# Patient Record
Sex: Female | Born: 1980
Health system: Southern US, Community
[De-identification: ages and names within clinical notes are randomized; demographics above are authoritative.]

## PROBLEM LIST (undated history)

## (undated) DIAGNOSIS — F419 Anxiety disorder, unspecified: Secondary | ICD-10-CM

## (undated) DIAGNOSIS — K219 Gastro-esophageal reflux disease without esophagitis: Secondary | ICD-10-CM

## (undated) DIAGNOSIS — G5603 Carpal tunnel syndrome, bilateral upper limbs: Secondary | ICD-10-CM

## (undated) DIAGNOSIS — G8929 Other chronic pain: Secondary | ICD-10-CM

## (undated) DIAGNOSIS — R945 Abnormal results of liver function studies: Secondary | ICD-10-CM

## (undated) DIAGNOSIS — K224 Dyskinesia of esophagus: Secondary | ICD-10-CM

## (undated) DIAGNOSIS — R7989 Other specified abnormal findings of blood chemistry: Secondary | ICD-10-CM

## (undated) DIAGNOSIS — F32A Depression, unspecified: Secondary | ICD-10-CM

## (undated) DIAGNOSIS — M549 Dorsalgia, unspecified: Secondary | ICD-10-CM

## (undated) DIAGNOSIS — F329 Major depressive disorder, single episode, unspecified: Secondary | ICD-10-CM

## (undated) DIAGNOSIS — M542 Cervicalgia: Secondary | ICD-10-CM

## (undated) HISTORY — DX: Anxiety disorder, unspecified: F41.9

## (undated) HISTORY — PX: NOSE SURGERY: SHX723

## (undated) HISTORY — PX: TONSILLECTOMY: SUR1361

## (undated) HISTORY — DX: Depression, unspecified: F32.A

## (undated) HISTORY — PX: CHOLECYSTECTOMY: SHX55

## (undated) HISTORY — DX: Other specified abnormal findings of blood chemistry: R79.89

## (undated) HISTORY — DX: Abnormal results of liver function studies: R94.5

## (undated) HISTORY — PX: DILATION AND CURETTAGE OF UTERUS: SHX78

## (undated) HISTORY — DX: Major depressive disorder, single episode, unspecified: F32.9

---

## 2011-01-04 ENCOUNTER — Emergency Department (HOSPITAL_BASED_OUTPATIENT_CLINIC_OR_DEPARTMENT_OTHER)
Admission: EM | Admit: 2011-01-04 | Discharge: 2011-01-04 | Disposition: A | Discharge status: Home / Self Care | Payer: Self-pay | Attending: Emergency Medicine | Admitting: Emergency Medicine

## 2011-01-04 ENCOUNTER — Encounter: Payer: Self-pay | Admitting: Emergency Medicine

## 2011-01-04 DIAGNOSIS — G8929 Other chronic pain: Secondary | ICD-10-CM | POA: Insufficient documentation

## 2011-01-04 DIAGNOSIS — M542 Cervicalgia: Secondary | ICD-10-CM | POA: Insufficient documentation

## 2011-01-04 DIAGNOSIS — M549 Dorsalgia, unspecified: Secondary | ICD-10-CM | POA: Insufficient documentation

## 2011-01-04 DIAGNOSIS — F17200 Nicotine dependence, unspecified, uncomplicated: Secondary | ICD-10-CM | POA: Insufficient documentation

## 2011-01-04 DIAGNOSIS — M436 Torticollis: Secondary | ICD-10-CM | POA: Insufficient documentation

## 2011-01-04 HISTORY — DX: Cervicalgia: M54.2

## 2011-01-04 HISTORY — DX: Other chronic pain: G89.29

## 2011-01-04 HISTORY — DX: Dorsalgia, unspecified: M54.9

## 2011-01-04 MED ORDER — HYDROCODONE-ACETAMINOPHEN 5-325 MG PO TABS: 2.0000 | ORAL_TABLET | ORAL | Status: AC | PRN

## 2011-01-04 MED ORDER — KETOROLAC TROMETHAMINE 60 MG/2ML IM SOLN
60.0000 mg | Freq: Once | INTRAMUSCULAR | Status: AC
  Administered 2011-01-04: 60 mg via INTRAMUSCULAR
  Filled 2011-01-04: qty 2

## 2011-01-04 MED ORDER — CYCLOBENZAPRINE HCL 10 MG PO TABS: 10.0000 mg | ORAL_TABLET | Freq: Two times a day (BID) | ORAL | Status: AC | PRN

## 2011-01-04 MED ORDER — IBUPROFEN 800 MG PO TABS: 800.0000 mg | ORAL_TABLET | Freq: Three times a day (TID) | ORAL | Status: AC
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
## 2011-01-04 NOTE — ED Notes (Signed)
Left sided neck pain, started yesterday, with numbness/tingling down left arm..  No known injuries.

## 2011-05-24 ENCOUNTER — Emergency Department (HOSPITAL_BASED_OUTPATIENT_CLINIC_OR_DEPARTMENT_OTHER)
Admission: EM | Admit: 2011-05-24 | Discharge: 2011-05-25 | Disposition: A | Discharge status: Home / Self Care | Payer: Medicaid Other | Attending: Emergency Medicine | Admitting: Emergency Medicine

## 2011-05-24 ENCOUNTER — Encounter (HOSPITAL_BASED_OUTPATIENT_CLINIC_OR_DEPARTMENT_OTHER): Payer: Self-pay | Admitting: *Deleted

## 2011-05-24 DIAGNOSIS — R059 Cough, unspecified: Secondary | ICD-10-CM | POA: Insufficient documentation

## 2011-05-24 DIAGNOSIS — J029 Acute pharyngitis, unspecified: Secondary | ICD-10-CM | POA: Insufficient documentation

## 2011-05-24 DIAGNOSIS — G8929 Other chronic pain: Secondary | ICD-10-CM | POA: Insufficient documentation

## 2011-05-24 DIAGNOSIS — F17200 Nicotine dependence, unspecified, uncomplicated: Secondary | ICD-10-CM | POA: Insufficient documentation

## 2011-05-24 DIAGNOSIS — R05 Cough: Secondary | ICD-10-CM | POA: Insufficient documentation

## 2011-05-24 NOTE — ED Provider Notes (Signed)
History  This chart was scribed for Sunnie Nielsen, MD by Bennett Scrape. This patient was seen in room MH09/MH09 and the patient's care was started at 11:37PM.  CSN: [id number] Arrival date & time: 05/24/2011 11:32 PM    Chief Complaint  Patient presents with  . Cough    Patient is a 30 y.o. female presenting with cough. The history is provided by the patient. No language interpreter was used.  Cough The current episode started more than 2 days ago. The problem occurs hourly. The problem has been gradually worsening. The cough is non-productive. There has been no fever.   Gabriela Benitez is a 30 y.o. female who presents to the Emergency Department complaining of one week of a intermittent non-productive cough. Pt states that her doctor told her that it was viral. She confirmed that her daughter is sick as well. Pt stated that she believed that she had strep throat due to seeing white spots on the back of her throat. Pt has a h/o chronic back and neck pain. Pt is a smoker.   Past Medical History  Diagnosis Date  . Back pain   . Chronic neck pain   . Chronic back pain     Past Surgical History  Procedure Date  . Tonsillectomy   . Cholecystectomy   . Cesarean section   . Dilation and curettage of uterus     No family history on file.  History  Substance Use Topics  . Smoking status: Current Everyday Smoker  . Smokeless tobacco: Not on file  . Alcohol Use: Yes     occ    OB History    Grav Para Term Preterm Abortions TAB SAB Ect Mult Living                  Review of Systems  Respiratory: Positive for cough.   All other systems reviewed and are negative.    Allergies  Codeine  Home Medications  No current outpatient prescriptions on file.  Triage Vitals: BP 109/65  Pulse 76  Temp(Src) 98.3 F (36.8 C) (Oral)  Resp 20  Ht 5\' 3"  (1.6 m)  Wt 148 lb (67.132 kg)  BMI 26.22 kg/m2  SpO2 98%  Physical Exam  Nursing note and vitals  reviewed. Constitutional: She is oriented to person, place, and time. She appears well-developed and well-nourished.  HENT:  Head: Normocephalic and atraumatic.       Erythema to the posterior pharynx, no exudates, uvula midline  Eyes: EOM are normal.  Neck: Neck supple.       No lymphnopathy    Cardiovascular: Normal rate and regular rhythm.   Pulmonary/Chest: Effort normal and breath sounds normal.  Abdominal: Soft. There is no tenderness.  Musculoskeletal: Normal range of motion.  Neurological: She is alert and oriented to person, place, and time.  Skin: Skin is warm and dry.  Psychiatric: She has a normal mood and affect. Her behavior is normal.    ED Course  Procedures (including critical care time)  DIAGNOSTIC STUDIES: Oxygen Saturation is 98% on room air, normal by my interpretation.    COORDINATION OF CARE: 11:40PM-Discussed treatment plan with patient at bedside and patient agreed to plan.   Results for orders placed during the hospital encounter of 05/24/11  RAPID STREP SCREEN      Component Value Range   Streptococcus, Group A Screen (Direct) NEGATIVE  NEGATIVE    Rapid strep screen sent, and reviewed   MDM   Pharyngitis with sick  contacts at home, likely viral. No respiratory distress and normal pulmonary exam. Plan outpatient treatment and primary care followup as needed.    I personally performed the services described in this documentation, which was scribed in my presence. The recorded information has been reviewed and considered.     Sunnie Nielsen, MD 05/25/11 0040

## 2011-05-24 NOTE — ED Notes (Signed)
Pt states she has had a cough for about a week. Daughter coughing as well. Saw Dr. Dx'd with virus.

## 2011-06-26 LAB — OB RESULTS CONSOLE ABO/RH: RH Type: POSITIVE

## 2011-06-26 LAB — OB RESULTS CONSOLE HEPATITIS B SURFACE ANTIGEN: Hepatitis B Surface Ag: NEGATIVE

## 2011-07-28 ENCOUNTER — Encounter (HOSPITAL_BASED_OUTPATIENT_CLINIC_OR_DEPARTMENT_OTHER): Payer: Self-pay | Admitting: *Deleted

## 2011-07-28 ENCOUNTER — Emergency Department (HOSPITAL_BASED_OUTPATIENT_CLINIC_OR_DEPARTMENT_OTHER)
Admission: EM | Admit: 2011-07-28 | Discharge: 2011-07-28 | Disposition: A | Discharge status: Home / Self Care | Payer: Medicaid Other | Attending: Emergency Medicine | Admitting: Emergency Medicine

## 2011-07-28 DIAGNOSIS — O2690 Pregnancy related conditions, unspecified, unspecified trimester: Secondary | ICD-10-CM | POA: Insufficient documentation

## 2011-07-28 DIAGNOSIS — G8929 Other chronic pain: Secondary | ICD-10-CM | POA: Insufficient documentation

## 2011-07-28 DIAGNOSIS — Z331 Pregnant state, incidental: Secondary | ICD-10-CM

## 2011-07-28 DIAGNOSIS — G43909 Migraine, unspecified, not intractable, without status migrainosus: Secondary | ICD-10-CM | POA: Insufficient documentation

## 2011-07-28 MED ORDER — FENTANYL CITRATE 0.05 MG/ML IJ SOLN
50.0000 ug | Freq: Once | INTRAMUSCULAR | Status: AC
  Administered 2011-07-28: 50 ug via INTRAVENOUS
  Filled 2011-07-28: qty 2

## 2011-07-28 MED ORDER — ONDANSETRON HCL 4 MG/2ML IJ SOLN
4.0000 mg | Freq: Once | INTRAMUSCULAR | Status: AC
  Administered 2011-07-28: 4 mg via INTRAVENOUS
  Filled 2011-07-28: qty 2

## 2011-07-28 MED ORDER — FENTANYL CITRATE 0.05 MG/ML IJ SOLN
INTRAMUSCULAR | Status: AC
  Filled 2011-07-28: qty 2

## 2011-07-28 MED ORDER — FENTANYL CITRATE 0.05 MG/ML IJ SOLN
50.0000 ug | Freq: Once | INTRAMUSCULAR | Status: AC
  Administered 2011-07-28: 50 ug via INTRAVENOUS

## 2011-07-28 MED ORDER — ONDANSETRON 4 MG PO TBDP: 4.0000 mg | ORAL_TABLET | Freq: Three times a day (TID) | ORAL | Status: AC | PRN

## 2011-07-28 MED ORDER — SODIUM CHLORIDE 0.9 % IV BOLUS (SEPSIS)
500.0000 mL | Freq: Once | INTRAVENOUS | Status: AC
  Administered 2011-07-28: 1000 mL via INTRAVENOUS

## 2011-07-28 NOTE — ED Notes (Signed)
Per mother daughter has had intermittent migraines over the past month, went to OB/GYN on Friday and was given a prescription for vicodin and took one yesterday which helped HA, but gave her esophagel spasms, has taken no meds today

## 2011-07-28 NOTE — ED Provider Notes (Signed)
History     CSN: [id number]  Arrival date & time 07/28/11  0915   First MD Initiated Contact with Patient 07/28/11 0920      Chief Complaint  Patient presents with  . Migraine     HPI Per mother daughter has had intermittent migraines over the past month, went to OB/GYN on Friday and was given a prescription for vicodin and took one yesterday which helped HA, but gave her esophagel spasms, has taken no meds today  Past Medical History  Diagnosis Date  . Back pain   . Chronic neck pain   . Chronic back pain   . Migraine     Past Surgical History  Procedure Date  . Tonsillectomy   . Cholecystectomy   . Cesarean section   . Dilation and curettage of uterus     No family history on file.  History  Substance Use Topics  . Smoking status: Former Games developer  . Smokeless tobacco: Not on file  . Alcohol Use: Yes     occ    OB History    Grav Para Term Preterm Abortions TAB SAB Ect Mult Living   1               Review of Systems Negative except as noted in history of present illness Allergies  Codeine  Home Medications   Current Outpatient Rx  Name Route Sig Dispense Refill  . HYDROCODONE-ACETAMINOPHEN 5-325 MG PO TABS Oral Take 1 tablet by mouth every 6 (six) hours as needed.    Marland Kitchen PRENATAL VITAMIN PO Oral Take by mouth.    . ONDANSETRON 4 MG PO TBDP Oral Take 1 tablet (4 mg total) by mouth every 8 (eight) hours as needed for nausea. 20 tablet 0    BP 99/53  Pulse 72  Temp(Src) 98 F (36.7 C) (Oral)  Resp 19  SpO2 96%  LMP 05/03/2011  Physical Exam  Nursing note and vitals reviewed. Constitutional: She is oriented to person, place, and time. She appears well-developed and well-nourished. She appears distressed.  HENT:  Head: Normocephalic and atraumatic.  Eyes: Pupils are equal, round, and reactive to light.  Neck: Normal range of motion.  Cardiovascular: Normal rate and intact distal pulses.   Pulmonary/Chest: No respiratory distress.  Abdominal:  Normal appearance. She exhibits no distension.  Musculoskeletal: Normal range of motion.  Neurological: She is alert and oriented to person, place, and time. She has normal strength. No cranial nerve deficit or sensory deficit. GCS eye subscore is 4. GCS verbal subscore is 5. GCS motor subscore is 6.       Patient photophobic  Skin: Skin is warm and dry. No rash noted.  Psychiatric: She has a normal mood and affect. Her behavior is normal.    ED Course  Procedures (including critical care time)  Labs Reviewed - No data to display No results found.   1. Migraine   2. Incidental intrauterine pregnancy       MDM  After treatment in the ED the patient feels back to baseline and wants to go home.   Nelia Shi, MD 07/28/11 [phone]

## 2012-01-15 ENCOUNTER — Encounter (HOSPITAL_COMMUNITY): Payer: Self-pay

## 2012-01-18 ENCOUNTER — Encounter (HOSPITAL_COMMUNITY): Payer: Self-pay

## 2012-01-19 ENCOUNTER — Encounter (HOSPITAL_COMMUNITY): Payer: Self-pay

## 2012-01-19 ENCOUNTER — Encounter (HOSPITAL_COMMUNITY)
Admission: RE | Admit: 2012-01-19 | Discharge: 2012-01-19 | Disposition: A | Discharge status: Home / Self Care | Payer: Medicaid Other | Source: Ambulatory Visit | Attending: Obstetrics & Gynecology | Admitting: Obstetrics & Gynecology

## 2012-01-19 HISTORY — DX: Dyskinesia of esophagus: K22.4

## 2012-01-19 HISTORY — DX: Gastro-esophageal reflux disease without esophagitis: K21.9

## 2012-01-19 HISTORY — DX: Carpal tunnel syndrome, bilateral upper limbs: G56.03

## 2012-01-19 LAB — CBC
MCH: 28.4 pg (ref 26.0–34.0)
Platelets: 343 10*3/uL (ref 150–400)
RBC: 3.49 MIL/uL — ABNORMAL LOW (ref 3.87–5.11)
RDW: 13.9 % (ref 11.5–15.5)
WBC: 8.8 10*3/uL (ref 4.0–10.5)

## 2012-01-19 LAB — TYPE AND SCREEN: ABO/RH(D): A POS

## 2012-01-19 LAB — SURGICAL PCR SCREEN: MRSA, PCR: NEGATIVE

## 2012-01-19 NOTE — Patient Instructions (Addendum)
   Your procedure is scheduled on: Wednesday July 31st  Enter through the [employer] [facility] at: 7:45am Pick up the phone at the desk and dial [phone] and inform us of your arrival.  Please call this number if you have any problems the morning of surgery: [phone]  Remember: Do not eat food after midnight: Tuesday Do not drink clear liquids after: Midnight Tuesday Take these medicines the morning of surgery with a SIP OF WATER: Zantac  Do not wear jewelry, make-up, or FINGER nail polish No metal in your hair or on your body. Do not wear lotions, powders, perfumes or deodorant. Do not shave 48 hours prior to surgery. Do not bring valuables to the hospital. Contacts, dentures or bridgework may not be worn into surgery.  Leave suitcase in the car. After Surgery it may be brought to your room. For patients being admitted to the hospital, checkout time is 11:00am the day of discharge.     Remember to use your hibiclens as instructed.Please shower with 1/2 bottle the evening before your surgery and the other 1/2 bottle the morning of surgery. Neck down avoiding private area.

## 2012-01-20 LAB — RPR: RPR Ser Ql: NONREACTIVE

## 2012-01-25 NOTE — OR Nursing (Signed)
Moved to 1430 per [place].surgeon prefers cases moved to afternoon.

## 2012-01-25 NOTE — Progress Notes (Signed)
Patient called to verify surgery start time.  Patient informed surgery 7/31 at 1445.  Patient instructed to arrive at 1315 and no solid foods after midnight 7/30 and clear liquid diet until 1030am DOS 01/27/12.  Patient verbalized understanding.

## 2012-01-26 ENCOUNTER — Other Ambulatory Visit: Payer: Self-pay | Admitting: Obstetrics & Gynecology

## 2012-01-26 ENCOUNTER — Encounter: Payer: Self-pay | Admitting: Obstetrics & Gynecology

## 2012-01-26 NOTE — H&P (Signed)
Gabriela Benitez is a 31 y.o. female presenting for she will be 39 weeks and 0 days time her scheduled cesarean delivery on 01/27/2012. Maternal Medical History:  Reason for admission: [name] is a 31 year old gravida 3 para 1-0-1-1 at 38 weeks and 6 days today by [facility] of 02/03/2012 by last menstrual period of 04/29/2011 and also 18 week ultrasound..  The patient is a transfer from [facility].  She states pregnancy is for the most part been uncomplicated.  She is told her group B strep is negative.  She stated that she labored to 5 cm after being induced at 37 weeks for an unknown reason and in to 5 cm.  Her pregnancy records are unavailable.  She was counseled on risks benefits and alternatives of trial of labor after cesarean section.  As opted for repeat cesarean delivery.  She apparently signed BTL papers that could not be found [place] [facility].  I papers here date 01/20/2012 to informed her that that would be acceptable for proceeding with bilateral tubal ligation at the time of cesarean delivery and that she will undergo only a repeat cesarean delivery.  She was counseled on risk benefits and alternatives of the procedure itself.   Transfusion counseling: The patient is aware of the possibility with this procedure of the use of blood replacement products in the form of a transfusion.  The patient is aware of the risks benefits and alternatives of these.  She is aware that we would only use these on an emergency basis or sometimes if considered to significantly reduce the risk of transfusion requirement during surgery.  The patient is aware of the risk of the each unit of transfusion having a risk of HIV being approximately 1 in 1 million and having a risk of hepatitis being 1 in 2000, with sometimes the development of chronic active hepatitis bleeding to the potential for liver failure and liver transplant, and having a low substantial risk of transfusion reaction were  her body would reject the replacement when we thought she needed it.  The patient understands them wishes to proceed. Cesarean section counseling: The patient is aware that we have decided to deliver the baby by cesarean section.  We believe this to be in the best interest of mom and/or baby at this point.  The patient is aware that the risks specific to the procedure are that there is a potential for bleeding and the possibility of a transfusion, there is a potential for damage to the uterus that would preclude her from having a future vaginal delivery, there is a potential for damage to other intra-abdominal organs to include ovaries, tubes, bladder, ureters, and bowel, sometimes recognized at the time of surgery and repaired at that time with a potential for a larger incision and/or a second surgeon.  The patient is also aware of the fact that these may not be discovered until later date and that she would need to let us know about any postoperative complications by calling me, the office or proceeding to the emergency room.  If these complications are none found at the time of surgery and are discovered later date may need surgical repair at that time.  The patient is also aware the potential for infection and the potential for prolonged hospital stay and the potential for antibiotics on an inpatient and/or outpatient basis.  Patient is aware of the potential for damage to the uterus closed by complications related to the pregnancy that may  require a hysterectomy to control bleeding in the postpartum period.  She understands the above and wishes to proceed with the procedure. She still wishes to proceed with the procedure.   Fetal activity: Perceived fetal activity is normal.    Prenatal complications: No bleeding, cholelithiasis, HIV, hypertension, infection, IUGR, nephrolithiasis, oligohydramnios, placental abnormality, polyhydramnios, pre-eclampsia, preterm labor, substance abuse, thrombocytopenia or  thrombophilia.   Prenatal Complications - Diabetes: none.    OB History    Grav Para Term Preterm Abortions TAB SAB Ect Mult Living   3 1 1  1  1   1      Past Medical History  Diagnosis Date  . Back pain   . Chronic neck pain   . Chronic back pain   . Migraine   . Carpal tunnel syndrome on both sides   . Esophageal spasm     uses aloe vera with success  . GERD (gastroesophageal reflux disease)     Zantac   Past Surgical History  Procedure Date  . Tonsillectomy   . Cholecystectomy   . Cesarean section 2007  . Dilation and curettage of uterus    Family History: family history is not on file. family history is significant for Huntington's chorea Social History:  reports that she has been smoking Cigarettes.  She has a 2.75 pack-year smoking history. She does not have any smokeless tobacco history on file. She reports that she does not drink alcohol or use illicit drugs. Will patient states she uses tobacco 2-4 cigarettes per day.  It has been excessive maternal weight gain and 66 pounds  Prenatal Transfer Tool  Maternal Diabetes: No Genetic Screening: Declined Maternal Ultrasounds/Referrals: Normal Fetal Ultrasounds or other Referrals:  None Maternal Substance Abuse:  Yes:  Type: Smoker Significant Maternal Medications:  None Significant Maternal Lab Results:  None Other Comments:  Transfer OB at 38 weeks.  Review of Systems  Constitutional: Negative.   HENT: Negative.   Eyes: Negative.   Respiratory: Negative.   Cardiovascular: Negative.   Gastrointestinal: Negative.   Genitourinary: Negative.   Musculoskeletal: Negative.   Skin: Negative.   Neurological: Negative.   Endo/Heme/Allergies: Negative.   Psychiatric/Behavioral: Negative.       Last menstrual period 04/29/2011. Maternal Exam:  Abdomen: Patient reports no abdominal tenderness. Surgical scars: low transverse.   Fundal height is Term.   Estimated fetal weight is 7 pounds.   Fetal presentation:  vertex  Introitus: Normal vulva. Normal vagina.  Ferning test: not done.  Nitrazine test: not done. Amniotic fluid character: not assessed.  Pelvis: of concern for delivery.   Cervix: not evaluated.   Physical Exam  Constitutional: She is oriented to person, place, and time. She appears well-developed and well-nourished.  HENT:  Head: Normocephalic and atraumatic.  Mouth/Throat: No oropharyngeal exudate.  Eyes: Pupils are equal, round, and reactive to light. Right eye exhibits no discharge. Left eye exhibits no discharge.  Neck: Normal range of motion. Neck supple. No tracheal deviation present. No thyromegaly present.  Cardiovascular: Normal rate, regular rhythm and normal heart sounds.   Respiratory: Effort normal and breath sounds normal.  GI: Soft.       Gravid nontender  Genitourinary: Vagina normal.  Musculoskeletal: Normal range of motion.  Neurological: She is alert and oriented to person, place, and time. She has normal reflexes.  Skin: Skin is warm and dry. No rash noted.  Psychiatric: She has a normal mood and affect. Her behavior is normal. Judgment and thought content normal.  Prenatal labs: ABO, Rh: --/--/A POS, A POS (07/23 1425) Antibody: NEG (07/23 1425) Rubella:   immune 23 RPR: NON REACTIVE (07/23 1425)  HBsAg: Negative (12/28 0000)  HIV: Non-reactive (12/28 0000)  GBS:   negative   Assessment/Plan: 1. IUP at 30 weeks and 6 days to be 39 weeks and 0 days number schedule cesarean delivery on 01/27/2012 2. Excessive maternal weight gain 3. History cesarean delivery declines TOLAC 4. Family history of Huntington's chorea 5.  tobacco abuse 6. Esophageal spasm 7. Desires BTL, but unfortunately would not be up to do at this time   Plan: 1. Proceed with repeat cesarean delivery, we will use Interceed,  Daeshawn Redmann H. 01/26/2012, 6:12 PM

## 2012-01-27 ENCOUNTER — Encounter (HOSPITAL_COMMUNITY): Payer: Self-pay | Admitting: Anesthesiology

## 2012-01-27 ENCOUNTER — Encounter (HOSPITAL_COMMUNITY): Payer: Self-pay | Admitting: *Deleted

## 2012-01-27 ENCOUNTER — Inpatient Hospital Stay (HOSPITAL_COMMUNITY)
Admission: AD | Admit: 2012-01-27 | Discharge: 2012-01-29 | DRG: 765 | Disposition: A | Discharge status: Home / Self Care | Payer: Medicaid Other | Source: Ambulatory Visit | Attending: Obstetrics & Gynecology | Admitting: Obstetrics & Gynecology

## 2012-01-27 ENCOUNTER — Encounter (HOSPITAL_COMMUNITY)
Admission: AD | Disposition: A | Discharge status: Home / Self Care | Payer: Self-pay | Source: Ambulatory Visit | Attending: Obstetrics & Gynecology

## 2012-01-27 ENCOUNTER — Inpatient Hospital Stay (HOSPITAL_COMMUNITY): Payer: Medicaid Other

## 2012-01-27 ENCOUNTER — Encounter (HOSPITAL_COMMUNITY): Payer: Self-pay

## 2012-01-27 DIAGNOSIS — D62 Acute posthemorrhagic anemia: Secondary | ICD-10-CM | POA: Diagnosis not present

## 2012-01-27 DIAGNOSIS — Z98891 History of uterine scar from previous surgery: Secondary | ICD-10-CM

## 2012-01-27 DIAGNOSIS — O34219 Maternal care for unspecified type scar from previous cesarean delivery: Principal | ICD-10-CM | POA: Diagnosis present

## 2012-01-27 DIAGNOSIS — O9903 Anemia complicating the puerperium: Secondary | ICD-10-CM | POA: Diagnosis not present

## 2012-01-27 DIAGNOSIS — O99334 Smoking (tobacco) complicating childbirth: Secondary | ICD-10-CM | POA: Diagnosis present

## 2012-01-27 LAB — PREPARE RBC (CROSSMATCH)

## 2012-01-27 SURGERY — Surgical Case
Anesthesia: Spinal | Site: Abdomen | Wound class: Clean Contaminated

## 2012-01-27 MED ORDER — DIPHENHYDRAMINE HCL 25 MG PO CAPS: 25.0000 mg | ORAL_CAPSULE | Freq: Four times a day (QID) | ORAL | Status: DC | PRN

## 2012-01-27 MED ORDER — OXYTOCIN 10 UNIT/ML IJ SOLN
INTRAMUSCULAR | Status: AC
  Filled 2012-01-27: qty 4

## 2012-01-27 MED ORDER — DIPHENHYDRAMINE HCL 25 MG PO CAPS: 25.0000 mg | ORAL_CAPSULE | ORAL | Status: DC | PRN

## 2012-01-27 MED ORDER — PRENATAL MULTIVITAMIN CH
1.0000 | ORAL_TABLET | Freq: Every day | ORAL | Status: DC
  Administered 2012-01-28 – 2012-01-29 (×2): 1 via ORAL
  Filled 2012-01-27 (×2): qty 1

## 2012-01-27 MED ORDER — OXYTOCIN 40 UNITS IN LACTATED RINGERS INFUSION - SIMPLE MED: 62.5000 mL/h | INTRAVENOUS | Status: AC

## 2012-01-27 MED ORDER — FENTANYL CITRATE 0.05 MG/ML IJ SOLN
INTRAMUSCULAR | Status: AC
  Filled 2012-01-27: qty 2

## 2012-01-27 MED ORDER — MORPHINE SULFATE (PF) 0.5 MG/ML IJ SOLN
INTRAMUSCULAR | Status: DC | PRN
  Administered 2012-01-27: 4.85 mg via INTRAVENOUS

## 2012-01-27 MED ORDER — NALOXONE HCL 0.4 MG/ML IJ SOLN: 0.4000 mg | INTRAMUSCULAR | Status: DC | PRN

## 2012-01-27 MED ORDER — TETANUS-DIPHTH-ACELL PERTUSSIS 5-2.5-18.5 LF-MCG/0.5 IM SUSP: 0.5000 mL | Freq: Once | INTRAMUSCULAR | Status: DC

## 2012-01-27 MED ORDER — SCOPOLAMINE 1 MG/3DAYS TD PT72
MEDICATED_PATCH | TRANSDERMAL | Status: AC
  Filled 2012-01-27: qty 1

## 2012-01-27 MED ORDER — ONDANSETRON HCL 4 MG/2ML IJ SOLN: 4.0000 mg | Freq: Three times a day (TID) | INTRAMUSCULAR | Status: DC | PRN

## 2012-01-27 MED ORDER — CEFAZOLIN SODIUM-DEXTROSE 2-3 GM-% IV SOLR
INTRAVENOUS | Status: AC
  Filled 2012-01-27: qty 50

## 2012-01-27 MED ORDER — WITCH HAZEL-GLYCERIN EX PADS: 1.0000 "application " | MEDICATED_PAD | CUTANEOUS | Status: DC | PRN

## 2012-01-27 MED ORDER — FENTANYL CITRATE 0.05 MG/ML IJ SOLN
INTRAMUSCULAR | Status: DC | PRN
  Administered 2012-01-27: 75 ug via INTRAVENOUS

## 2012-01-27 MED ORDER — MEPERIDINE HCL 25 MG/ML IJ SOLN: 6.2500 mg | INTRAMUSCULAR | Status: DC | PRN

## 2012-01-27 MED ORDER — OXYTOCIN 10 UNIT/ML IJ SOLN
40.0000 [IU] | INTRAVENOUS | Status: DC | PRN
  Administered 2012-01-27: 40 [IU] via INTRAVENOUS

## 2012-01-27 MED ORDER — KETOROLAC TROMETHAMINE 30 MG/ML IJ SOLN
30.0000 mg | Freq: Four times a day (QID) | INTRAMUSCULAR | Status: AC | PRN
  Administered 2012-01-28 (×2): 30 mg via INTRAVENOUS
  Filled 2012-01-27 (×2): qty 1

## 2012-01-27 MED ORDER — MORPHINE SULFATE 0.5 MG/ML IJ SOLN
INTRAMUSCULAR | Status: AC
  Filled 2012-01-27: qty 10

## 2012-01-27 MED ORDER — EPHEDRINE SULFATE 50 MG/ML IJ SOLN
INTRAMUSCULAR | Status: DC | PRN
  Administered 2012-01-27: 5 mg via INTRAVENOUS
  Administered 2012-01-27: 10 mg via INTRAVENOUS
  Administered 2012-01-27: 15 mg via INTRAVENOUS
  Administered 2012-01-27 (×2): 10 mg via INTRAVENOUS

## 2012-01-27 MED ORDER — CEFAZOLIN SODIUM-DEXTROSE 2-3 GM-% IV SOLR
2.0000 g | Freq: Once | INTRAVENOUS | Status: AC
  Administered 2012-01-27: 2 g via INTRAVENOUS

## 2012-01-27 MED ORDER — ONDANSETRON HCL 4 MG/2ML IJ SOLN: 4.0000 mg | INTRAMUSCULAR | Status: DC | PRN

## 2012-01-27 MED ORDER — DIPHENHYDRAMINE HCL 50 MG/ML IJ SOLN
12.5000 mg | INTRAMUSCULAR | Status: DC | PRN
  Administered 2012-01-27: 12.5 mg via INTRAVENOUS
  Filled 2012-01-27: qty 1

## 2012-01-27 MED ORDER — FENTANYL CITRATE 0.05 MG/ML IJ SOLN
25.0000 ug | INTRAMUSCULAR | Status: DC | PRN
  Administered 2012-01-27 (×2): 50 ug via INTRAVENOUS

## 2012-01-27 MED ORDER — SIMETHICONE 80 MG PO CHEW
80.0000 mg | CHEWABLE_TABLET | Freq: Three times a day (TID) | ORAL | Status: DC
  Administered 2012-01-28 – 2012-01-29 (×5): 80 mg via ORAL

## 2012-01-27 MED ORDER — SODIUM CHLORIDE 0.9 % IV SOLN
1.0000 ug/kg/h | INTRAVENOUS | Status: DC | PRN
  Filled 2012-01-27: qty 2.5

## 2012-01-27 MED ORDER — SIMETHICONE 80 MG PO CHEW: 80.0000 mg | CHEWABLE_TABLET | ORAL | Status: DC | PRN

## 2012-01-27 MED ORDER — LACTATED RINGERS IV SOLN
INTRAVENOUS | Status: DC
  Administered 2012-01-27: 125 mL/h via INTRAVENOUS
  Administered 2012-01-27: 21:00:00 via INTRAVENOUS
  Administered 2012-01-27: 125 mL/h via INTRAVENOUS
  Administered 2012-01-27: 20:00:00 via INTRAVENOUS

## 2012-01-27 MED ORDER — SCOPOLAMINE 1 MG/3DAYS TD PT72
1.0000 | MEDICATED_PATCH | Freq: Once | TRANSDERMAL | Status: DC
  Administered 2012-01-27: 1.5 mg via TRANSDERMAL

## 2012-01-27 MED ORDER — KETOROLAC TROMETHAMINE 30 MG/ML IJ SOLN: 30.0000 mg | Freq: Four times a day (QID) | INTRAMUSCULAR | Status: AC | PRN

## 2012-01-27 MED ORDER — PROMETHAZINE HCL 25 MG/ML IJ SOLN: 6.2500 mg | INTRAMUSCULAR | Status: DC | PRN

## 2012-01-27 MED ORDER — IBUPROFEN 600 MG PO TABS
600.0000 mg | ORAL_TABLET | Freq: Four times a day (QID) | ORAL | Status: DC
  Administered 2012-01-28 – 2012-01-29 (×4): 600 mg via ORAL
  Filled 2012-01-27 (×5): qty 1

## 2012-01-27 MED ORDER — SODIUM CHLORIDE 0.9 % IJ SOLN: 3.0000 mL | INTRAMUSCULAR | Status: DC | PRN

## 2012-01-27 MED ORDER — DIBUCAINE 1 % RE OINT: 1.0000 "application " | TOPICAL_OINTMENT | RECTAL | Status: DC | PRN

## 2012-01-27 MED ORDER — ONDANSETRON HCL 4 MG/2ML IJ SOLN
INTRAMUSCULAR | Status: AC
  Filled 2012-01-27: qty 2

## 2012-01-27 MED ORDER — SCOPOLAMINE 1 MG/3DAYS TD PT72
MEDICATED_PATCH | TRANSDERMAL | Status: AC
  Administered 2012-01-27: 1.5 mg via TRANSDERMAL
  Filled 2012-01-27: qty 1

## 2012-01-27 MED ORDER — MENTHOL 3 MG MT LOZG: 1.0000 | LOZENGE | OROMUCOSAL | Status: DC | PRN

## 2012-01-27 MED ORDER — SENNOSIDES-DOCUSATE SODIUM 8.6-50 MG PO TABS
2.0000 | ORAL_TABLET | Freq: Every day | ORAL | Status: DC
  Administered 2012-01-28: 2 via ORAL

## 2012-01-27 MED ORDER — ONDANSETRON HCL 4 MG PO TABS: 4.0000 mg | ORAL_TABLET | ORAL | Status: DC | PRN

## 2012-01-27 MED ORDER — PHENYLEPHRINE HCL 10 MG/ML IJ SOLN
INTRAMUSCULAR | Status: DC | PRN
  Administered 2012-01-27: 80 ug via INTRAVENOUS
  Administered 2012-01-27: 40 ug via INTRAVENOUS
  Administered 2012-01-27: 80 ug via INTRAVENOUS

## 2012-01-27 MED ORDER — ZOLPIDEM TARTRATE 5 MG PO TABS: 5.0000 mg | ORAL_TABLET | Freq: Every evening | ORAL | Status: DC | PRN

## 2012-01-27 MED ORDER — ONDANSETRON HCL 4 MG/2ML IJ SOLN
INTRAMUSCULAR | Status: DC | PRN
  Administered 2012-01-27: 4 mg via INTRAVENOUS

## 2012-01-27 MED ORDER — SCOPOLAMINE 1 MG/3DAYS TD PT72: 1.0000 | MEDICATED_PATCH | Freq: Once | TRANSDERMAL | Status: DC

## 2012-01-27 MED ORDER — KETOROLAC TROMETHAMINE 60 MG/2ML IM SOLN
INTRAMUSCULAR | Status: AC
  Filled 2012-01-27: qty 2

## 2012-01-27 MED ORDER — LACTATED RINGERS IV SOLN
INTRAVENOUS | Status: DC
  Administered 2012-01-28: 04:00:00 via INTRAVENOUS

## 2012-01-27 MED ORDER — KETOROLAC TROMETHAMINE 60 MG/2ML IM SOLN
60.0000 mg | Freq: Once | INTRAMUSCULAR | Status: AC | PRN
  Administered 2012-01-27: 60 mg via INTRAMUSCULAR

## 2012-01-27 MED ORDER — EPHEDRINE 5 MG/ML INJ
INTRAVENOUS | Status: AC
  Filled 2012-01-27: qty 10

## 2012-01-27 MED ORDER — DIPHENHYDRAMINE HCL 50 MG/ML IJ SOLN: 25.0000 mg | INTRAMUSCULAR | Status: DC | PRN

## 2012-01-27 MED ORDER — METOCLOPRAMIDE HCL 5 MG/ML IJ SOLN: 10.0000 mg | Freq: Three times a day (TID) | INTRAMUSCULAR | Status: DC | PRN

## 2012-01-27 MED ORDER — OXYCODONE-ACETAMINOPHEN 5-325 MG PO TABS
1.0000 | ORAL_TABLET | ORAL | Status: DC | PRN
  Administered 2012-01-28: 2 via ORAL
  Administered 2012-01-28: 1 via ORAL
  Administered 2012-01-28: 2 via ORAL
  Administered 2012-01-28: 1 via ORAL
  Administered 2012-01-29 (×4): 2 via ORAL
  Filled 2012-01-27: qty 1
  Filled 2012-01-27 (×6): qty 2
  Filled 2012-01-27: qty 1

## 2012-01-27 MED ORDER — KETOROLAC TROMETHAMINE 30 MG/ML IJ SOLN: 15.0000 mg | Freq: Once | INTRAMUSCULAR | Status: DC | PRN

## 2012-01-27 MED ORDER — LANOLIN HYDROUS EX OINT: 1.0000 "application " | TOPICAL_OINTMENT | CUTANEOUS | Status: DC | PRN

## 2012-01-27 SURGICAL SUPPLY — 30 items
BENZOIN TINCTURE PRP APPL 2/3 (GAUZE/BANDAGES/DRESSINGS) IMPLANT
CLOTH BEACON ORANGE TIMEOUT ST (SAFETY) ×2 IMPLANT
DRSG COVADERM 4X10 (GAUZE/BANDAGES/DRESSINGS) ×2 IMPLANT
ELECT REM PT RETURN 9FT ADLT (ELECTROSURGICAL) ×2
ELECTRODE REM PT RTRN 9FT ADLT (ELECTROSURGICAL) ×1 IMPLANT
EXTRACTOR VACUUM M CUP 4 TUBE (SUCTIONS) IMPLANT
GLOVE BIO SURGEON STRL SZ 6.5 (GLOVE) IMPLANT
GLOVE BIO SURGEON STRL SZ7.5 (GLOVE) ×2 IMPLANT
GLOVE BIOGEL PI IND STRL 7.0 (GLOVE) IMPLANT
GLOVE BIOGEL PI IND STRL 8 (GLOVE) ×1 IMPLANT
GLOVE BIOGEL PI INDICATOR 7.0 (GLOVE)
GLOVE BIOGEL PI INDICATOR 8 (GLOVE) ×1
GOWN PREVENTION PLUS LG XLONG (DISPOSABLE) ×2 IMPLANT
GOWN PREVENTION PLUS XLARGE (GOWN DISPOSABLE) IMPLANT
GOWN STRL REIN XL XLG (GOWN DISPOSABLE) ×2 IMPLANT
KIT ABG SYR 3ML LUER SLIP (SYRINGE) IMPLANT
NEEDLE HYPO 25X5/8 SAFETYGLIDE (NEEDLE) IMPLANT
NS IRRIG 1000ML POUR BTL (IV SOLUTION) ×2 IMPLANT
PACK C SECTION WH (CUSTOM PROCEDURE TRAY) ×2 IMPLANT
PAD ABD 7.5X8 STRL (GAUZE/BANDAGES/DRESSINGS) ×2 IMPLANT
SLEEVE SCD COMPRESS KNEE MED (MISCELLANEOUS) IMPLANT
STRIP CLOSURE SKIN 1/2X4 (GAUZE/BANDAGES/DRESSINGS) ×2 IMPLANT
SUT GUT PLAIN 0 CT-3 TAN 27 (SUTURE) IMPLANT
SUT MON AB 4-0 PS1 27 (SUTURE) ×2 IMPLANT
SUT PLAIN 2 0 XLH (SUTURE) ×2 IMPLANT
SUT VIC AB 0 CT1 36 (SUTURE) ×8 IMPLANT
TAPE CLOTH SURG 4X10 WHT LF (GAUZE/BANDAGES/DRESSINGS) ×2 IMPLANT
TOWEL OR 17X24 6PK STRL BLUE (TOWEL DISPOSABLE) ×4 IMPLANT
TRAY FOLEY CATH 14FR (SET/KITS/TRAYS/PACK) ×2 IMPLANT
WATER STERILE IRR 1000ML POUR (IV SOLUTION) IMPLANT

## 2012-01-27 NOTE — Anesthesia Preprocedure Evaluation (Signed)
Anesthesia Evaluation  Patient identified by MRN, date of birth, ID band Patient awake    Reviewed: Allergy & Precautions, H&P , NPO status , Patient's Chart, lab work & pertinent test results  Airway Mallampati: I TM Distance: >3 FB Neck ROM: full    Dental No notable dental hx.    Pulmonary neg pulmonary ROS,    Pulmonary exam normal       Cardiovascular negative cardio ROS      Neuro/Psych negative psych ROS   GI/Hepatic negative GI ROS, Neg liver ROS,   Endo/Other  Morbid obesity  Renal/GU negative Renal ROS  negative genitourinary   Musculoskeletal negative musculoskeletal ROS (+)   Abdominal (+) + obese,   Peds negative pediatric ROS (+)  Hematology negative hematology ROS (+)   Anesthesia Other Findings   Reproductive/Obstetrics (+) Pregnancy                           Anesthesia Physical Anesthesia Plan  ASA: III  Anesthesia Plan: Spinal   Post-op Pain Management:    Induction:   Airway Management Planned:   Additional Equipment:   Intra-op Plan:   Post-operative Plan:   Informed Consent: I have reviewed the patients History and Physical, chart, labs and discussed the procedure including the risks, benefits and alternatives for the proposed anesthesia with the patient or authorized representative who has indicated his/her understanding and acceptance.     Plan Discussed with: CRNA and Surgeon  Anesthesia Plan Comments:         Anesthesia Quick Evaluation

## 2012-01-27 NOTE — Anesthesia Procedure Notes (Signed)

## 2012-01-27 NOTE — Interval H&P Note (Signed)
History and Physical Interval Note:  01/27/2012 3:10 PM  Gabriela Benitez  has presented today for surgery, with the diagnosis of repeat c-section  The various methods of treatment have been discussed with the patient and family. After consideration of risks, benefits and other options for treatment, the patient has consented to  Procedure(s) (LRB): CESAREAN SECTION (N/A) as a surgical intervention .  The patient's history has been reviewed, patient examined, no change in status, stable for surgery.  I have reviewed the patient's chart and labs.  Questions were answered to the patient's satisfaction.     Ferman Basilio H.  Updated H&P The patient was seen and is well aware of the procedure.  Is 39 weeks and 0 days for repeat cesarean delivery.  We will be unable to perform her bilateral tubal ligation secondary to consent concerns.  All questions were answered and patient agreed.

## 2012-01-27 NOTE — Op Note (Signed)
Cesarean Section Procedure Note   01/27/2012, 9:26 PM  Gabriela Benitez      Pre-operative Diagnosis: 1.IUP [redacted]w[redacted]d             2.  history of cesarean delivery             3.  declines trial of labor after cesarean             4.  family history of Huntington's chorea     5. desire sterilization- (papers not done in time    Post-operative Diagnosis: Same plus delivered   Surgeon: Delbert Harness.  Assistants: Dr. Gerald Leitz  Anesthesia: spinal    Findings:      1. Viable female infant with Apgars  APGAR (1 MIN):   APGAR (5 MINS):   APGAR (10 MINS):  with a weight of @BABYWGTLBSEBC @ @BABYWGTOZEBC @     2. umbilical cord artery pH 7.2    3. the placenta was calcified is a term placenta.    4. a true knot was noted in the cord   Estimated Blood Loss: * No blood loss amount entered *l   Total IV Fluids:   Urine Output: 300CC OF clear urine  Specimens: 1. none  Complications: no complications  Disposition:  PACU - hemodynamically stable.  Maternal Condition: stable  Baby condition / location:  nursery-stable  Indications:Scheduled Proceedure/Maternal Request  decline trial labor after cesarean    The patient presented on to the office as a transfer OB patient at 37 weeks .  Should history of cesarean delivery secondary to arrested dilatation at 37 weeks.  She declined a trial labor after cesarean.  She also desires sterilization, but her insurance papers were not ready at that time.  She that she had sounded previously, but they could not be found at [facility].      The risks, benefits, complications, treatment options, and expected outcomes were discussed with the patient. The patient concurred with the proposed plan, giving informed consent  Procedure Details:  The patient was moved to the operating room.  The patient was identified as Gabriela Benitez and the procedure verified as C-Section Delivery. A Time Out was held and the above information  confirmed. Patient did receive Ancef 2 g IV prior to the procedure.   The patient was draped and prepped in the usual sterile manner.  Anesthesia was noted to be adequate.   Pfannenstiel incsion was made and the subcutaneous tissue was incised to the fascia. The fascial was nicked over both rectus muscle bellies and then was extended transversely with Bovie cautery. Kocher clamps and the the fascia was separated from the underlying rectus muscle superiorly and inferiorly. The peritoneum was entered bluntly. The utero-vesical peritoneal reflection was elevated and incised transversely and the bladder flap was bluntly freed from the lower uterine segment. A low transverse uterine incision was made and maintained within the lower uterine segment. Delivered from cephalic presentation was pending gram living newborn female infant with Apgar scores of 9 at one minute and 9 at five minutes.  The umbilical cord was clamped and cut cord. A sample was obtained for evaluation. The placenta was removed Intact and appeared normal with a notation of a nuchal cord reduced at the time of delivery..    The uterine incision was closed with running locked sutures of 0 Vicryl in 2 layers. The lower uterine segment was thinned and there was an extension the left uterine vessels.  O'Leary stitch was thrown.  Good hemostasis was observed after a second O'Leary stitch was done Hemostasis was observed.  The previously placed lap sponges and the paracolic gutters were removed after the upper abdomen was irrigated and cleared of all clot and debris.  And the uterus had been returned to the abdomen..  Interceed was placed and a T-shaped pattern on the hysterotomy and anterior corpus.  The fascia was then reapproximated with running  sutures of  0 Vicryl. .  The skin was closed with 4 Monocryl in a subcuticular running fashion The wound was appropriately dressed patient placed on bed and taken to the recovery room in awake and stable  condition and the baby was in the mother's arms heading to the birthing room.   Instrument sponge lap and needle  counts were correct prior the abdominal closure and were correct at the conclusion of the case.   Findings: Thin lower uterine segment delivered from the vertex position viable female infant as above.  .  Ovaries and tubes were within normal limits.  Good fascial integrity and no evidence of hernia were noted.  Subcutaneous tissue was less than 2 inches and does not reapproximated.    Signed: Surgeon(s): [name]. [name], MD

## 2012-01-27 NOTE — Anesthesia Postprocedure Evaluation (Signed)
  Anesthesia Post-op Note  Patient: Gabriela Benitez  Procedure(s) Performed: Procedure(s) (LRB): CESAREAN SECTION (N/A)  Patient is awake, responsive, moving her legs, and has signs of resolution of her numbness. Pain and nausea are reasonably well controlled. Vital signs are stable and clinically acceptable. Oxygen saturation is clinically acceptable. There are no apparent anesthetic complications at this time. Patient is ready for discharge.

## 2012-01-27 NOTE — Transfer of Care (Signed)
Immediate Anesthesia Transfer of Care Note  Patient: Gabriela Benitez  Procedure(s) Performed: Procedure(s) (LRB): CESAREAN SECTION (N/A)  Patient Location: PACU  Anesthesia Type: Spinal  Level of Consciousness: awake  Airway & Oxygen Therapy: Patient Spontanous Breathing  Post-op Assessment: Report given to PACU RN and Post -op Vital signs reviewed and stable  Post vital signs: stable  Complications: No apparent anesthesia complications

## 2012-01-28 ENCOUNTER — Encounter (HOSPITAL_COMMUNITY): Payer: Self-pay | Admitting: Obstetrics & Gynecology

## 2012-01-28 LAB — TYPE AND SCREEN
ABO/RH(D): A POS
Unit division: 0

## 2012-01-28 LAB — CBC
MCH: 28.2 pg (ref 26.0–34.0)
MCHC: 32.7 g/dL (ref 30.0–36.0)
MCV: 86.4 fL (ref 78.0–100.0)
Platelets: 240 10*3/uL (ref 150–400)
RBC: 3.01 MIL/uL — ABNORMAL LOW (ref 3.87–5.11)

## 2012-01-28 MED ORDER — NALBUPHINE SYRINGE 5 MG/0.5 ML
5.0000 mg | INJECTION | Freq: Once | INTRAMUSCULAR | Status: AC
  Administered 2012-01-28: 5 mg via INTRAVENOUS
  Filled 2012-01-28: qty 0.5

## 2012-01-28 NOTE — Anesthesia Postprocedure Evaluation (Signed)
  Anesthesia Post-op Note  Patient: Gabriela Benitez  Procedure(s) Performed: Procedure(s) (LRB): CESAREAN SECTION (N/A)  Patient Location: PACU and Women's Unit  Anesthesia Type: Epidural  Level of Consciousness: awake, alert , oriented and patient cooperative  Airway and Oxygen Therapy: Patient Spontanous Breathing  Post-op Pain: none  Post-op Assessment: Post-op Vital signs reviewed and Patient's Cardiovascular Status Stable  Post-op Vital Signs: Reviewed and stable  Complications: No apparent anesthesia complications

## 2012-01-28 NOTE — Addendum Note (Signed)
Addendum  created 01/28/12 4098 by Orlie Pollen, CRNA   Modules edited:Notes Section

## 2012-01-28 NOTE — Progress Notes (Signed)
UR chart review completed.  

## 2012-01-28 NOTE — Addendum Note (Signed)
Addendum  created 01/28/12 0845 by Orlie Pollen, CRNA   Modules edited:Anesthesia Flowsheet, Notes Section

## 2012-01-28 NOTE — Progress Notes (Addendum)
Subjective: Postpartum Day 1: Cesarean Delivery repeat Patient reports tolerating PO.  Has not slept well -secondary to insomnia - baby did go to nursery  Objective: Vital signs in last 24 hours: Temp:  [97.6 F (36.4 C)-98.5 F (36.9 C)] 97.9 F (36.6 C) (08/01 0815) Pulse Rate:  [75-94] 75  (08/01 0815) Resp:  [16-34] 18  (08/01 0815) BP: (90-123)/(48-91) 99/56 mmHg (08/01 0815) SpO2:  [95 %-100 %] 97 % (08/01 0815) Weight:  [96.163 kg (212 lb)] 96.163 kg (212 lb) (07/31 1326) Good UO Physical Exam:  General: alert, cooperative and no distress Lochia: appropriate Uterine Fundus: firm and appropriately tender Incision: no significant drainage and dressing dry  DVT Evaluation: No evidence of DVT seen on physical exam. Negative Homan's sign. No cords or calf tenderness. No significant calf/ankle edema.   Basename 01/28/12 0610  HGB 8.5*  HCT 26.0*    Assessment/Plan: Status post Cesarean section. Doing well postoperatively.  Anemia combined and chronic of pregnancy Continue current care. Will dc foley Mehgan Santmyer H. 01/28/2012, 11:21 AM

## 2012-01-29 NOTE — Discharge Summary (Signed)
Obstetric Discharge Summary Reason for Admission: cesarean section Prenatal Procedures: none Intrapartum Procedures: cesarean: low cervical, transverse Postpartum Procedures: none Complications-Operative and Postpartum: none  Hemoglobin  Date Value Range Status  01/28/2012 8.5* 12.0 - 15.0 g/dL Final     HCT  Date Value Range Status  01/28/2012 26.0* 36.0 - 46.0 % Final    Discharge Diagnoses: Term Pregnancy-delivered  Discharge Information: Date: 01/29/2012 Activity: pelvic rest Diet: routine Medications: per Dr. [name] Condition: stable Instructions: refer to practice specific booklet Discharge to: home   Newborn Data: Live born  Information for the patient's newborn:  Brealynn, Contino [[id number]]  female ; APGAR , ; weight ;  Home with mother.  Janeann Paisley E 01/29/2012, 3:02 PM

## 2012-01-29 NOTE — Progress Notes (Signed)
Subjective: Postpartum Day 2: Cesarean Delivery repeat Patient reports incisional pain, tolerating PO and no problems voiding.   States she is doing well except for some incisional pain and that she did get more sleep Objective: Vital signs in last 24 hours: Temp:  [97.9 F (36.6 C)-99 F (37.2 C)] 98 F (36.7 C) (08/02 0604) Pulse Rate:  [79-85] 79  (08/02 0604) Resp:  [18-20] 18  (08/02 0604) BP: (97-110)/(52-71) 110/71 mmHg (08/02 0604)  Physical Exam:  General: alert, cooperative and no distress Lochia: appropriate Uterine Fundus: U. -1 and appropriately tender Incision: healing well, no significant drainage, no significant erythema DVT Evaluation: No evidence of DVT seen on physical exam. Negative Homan's sign. No cords or calf tenderness. No significant calf/ankle edema.   Basename 01/28/12 0610  HGB 8.5*  HCT 26.0*    Assessment/Plan: Status post Cesarean section. Doing well postoperatively.  Discharge patient tomorrow. Secondary to her anemia of acute and chronic blood loss will give her a prescription for iron sulfate 325 mg by mouth twice a day this is written was handed to the RN today.     Also, the prescription for the Percocet 5/325 one to 2 by mouth every 4-6 hours when necessary pain was given to the nurse as well.  Gabriela Benitez H. 01/29/2012, 8:21 AM

## 2012-01-29 NOTE — Progress Notes (Signed)
Post Partum Day 2 Subjective: No complaints no complaints  Objective: Blood pressure 105/65, pulse 80, temperature 98.2 F (36.8 C), temperature source Oral, resp. rate 18, height 5\' 3"  (1.6 m), weight 212 lb (96.163 kg), last menstrual period 04/29/2011, SpO2 97.00%, unknown if currently breastfeeding.  Physical Exam:  General: alert and cooperative Lochia: appropriate Uterine Fundus: firm Episiotomy, laceration : na DVT Evaluation: No evidence of DVT seen on physical exam.   Basename 01/28/12 0610  HGB 8.5*  HCT 26.0*    Assessment/Plan: Discharge home.  Dr [name] saw patient and informed she could go home this afternoon if stable and desired.  Per Nurse, no complaints and patient wants to go home and has a prescription from Dr. [name].   LOS: 2 days   Ericberto Padget E 01/29/2012, 2:58 PM

## 2012-03-17 ENCOUNTER — Inpatient Hospital Stay (HOSPITAL_COMMUNITY): Admission: RE | Admit: 2012-03-17 | Payer: Medicaid Other | Source: Ambulatory Visit

## 2012-03-18 ENCOUNTER — Encounter (HOSPITAL_COMMUNITY): Admission: RE | Payer: Self-pay | Source: Ambulatory Visit

## 2012-03-18 ENCOUNTER — Other Ambulatory Visit: Payer: Self-pay | Admitting: Obstetrics & Gynecology

## 2012-03-18 ENCOUNTER — Ambulatory Visit (HOSPITAL_COMMUNITY)
Admission: RE | Admit: 2012-03-18 | Payer: Medicaid Other | Source: Ambulatory Visit | Admitting: Obstetrics & Gynecology

## 2012-03-18 SURGERY — LIGATION, FALLOPIAN TUBE, BILATERAL
Anesthesia: Choice | Laterality: Bilateral

## 2012-03-18 NOTE — H&P (Signed)
Gabriela Benitez is an 31 y.o. female.  Gravida 3 para 2-0-1-2 that presents for laparoscopic interval sterilization.  She states she is taking no pain meds.  She states that she is breast-feeding and was to some note on line as well.  She states that the left lower quadrant pain is much less frequent.  She believes is related to her incisional pain which has mostly resolved.  It is exacerbated with motion.  She is currently using Depo-Provera for her contraception. Contraceptive counseling: Abstinence was reviewed as the best method to avoid pregnancy.  Reviewed the different classes of birth control methods to include barrier vs. hormonal.  With barrier methods we discussed condoms, diaphragm, spermicides and the ParaGard IUD and also mentioned was a female condom.  I also mentioned the use of sterilization for the female or female.  We discussed how you would use each of these.  We also discussed the hormonal methods.  These included progesterone only.  This included a pill, an injection, a rod placed in your arm, and the Mirena IUD.  We discussed at each of these would be used.  We discussed how these can be taken systemically or not as with the IUD.  We discussed the risks and benefits of progesterone only contraception.  We also discussed combination hormonal contraception with estrogen and progesterone.  We discussed how this could be administered with a ring, a patch, pill form, and even a chewable pill form.  We also discussed how these could be used to manipulate cycle control.  We also discussed the risks of combination hormonal contraception to include but not limited to possible elevations in blood pressure, possible problems with glucose/sugar metabolism, and more serious problems is that it would have to be taken every day (compliance), reduced effectiveness with the use of other medications specifically antibiotics.  And the most serious risk would be that she could develop a deep vein thrombosis  and/or pulmonary embolus.  The patient was counseled on the risks benefits and alternatives of a sterilization procedure.  She is aware of the many other methods for contraception.  She is aware of the benefit of sterilization and not having to worry on an intermittent basis regarding contraception.  The risks specific to the procedure are that it is considered irreversible, that is considered permanent, that she understands she can no longer get pregnant or desire pregnancy, that the procedure has a failure rate of approximately 4-10 in a 1000 woman years.  With the failures half would be in the uterus an unwanted pregnancies and that would be ectopic pregnancies.  The pregnancy that is an ectopic is potentially fatal.  The patient would need to seek immediate medical attention if she was ever found to be pregnant in the future until location of the pregnancy could be discovered.  She is also aware that any intra-abdominal procedure could potentially cause damage to other intra-abdominal organs to include bowel, bladder, ovaries and possibly a hernia of the abdominal wall.  Sometimes these complications are recognized at the time of surgery and are remedied at that time with the potential for a mother surgeon and the potential for a larger incision, and the possibility of a prolonged hospital stay..  Sometimes these complications are not recognized at the time of surgery and would require intervention at a later date when they are discovered.  It is important to notify my office or myself or go to the emergency room with any postoperative complications.  The patient is also  aware of the increased risk of scarring and the potential development of a not satisfactory cosmetic result and the potential development of an infection that may require antibiotics as an inpatient or as an outpatient..  All questions were answered from the patient and the patient wishes to proceed and I concur.  Transfusion  counseling: The patient is aware of the possibility with this procedure of the use of blood replacement products in the form of a transfusion.  The patient is aware of the risks benefits and alternatives of these.  She is aware that we would only use these on an emergency basis or sometimes if considered to significantly reduce the risk of transfusion requirement during surgery.  The patient is aware of the risk of the each unit of transfusion having a risk of HIV being approximately 1 in 1 million and having a risk of hepatitis being 1 in 2000, with sometimes the development of chronic active hepatitis bleeding to the potential for liver failure and liver transplant, and having a low substantial risk of transfusion reaction were her body would reject the replacement when we thought she needed it.  The patient understands them wishes to proceed. Pertinent Gynecological History: Menses: flow is moderate Bleeding: no IMB Contraception: condoms DES exposure: denies Blood transfusions: none Sexually transmitted diseases: no past history Previous GYN Procedures: DNC  Last mammogram: na Date: na Last pap: normal Date: 11/12 OB History: G3, P2-0-1-1   Menstrual History: Menarche age: na No LMP recorded.    Past Medical History  Diagnosis Date  . Back pain   . Chronic neck pain   . Chronic back pain   . Migraine   . Carpal tunnel syndrome on both sides   . Esophageal spasm     uses aloe vera with success  . GERD (gastroesophageal reflux disease)     Zantac    Past Surgical History  Procedure Date  . Tonsillectomy   . Cholecystectomy   . Cesarean section 2007  . Dilation and curettage of uterus   . Cesarean section 01/27/2012    Procedure: CESAREAN SECTION;  Surgeon: Delbert Harness, MD;  Location: [facility];  Service: Gynecology;  Laterality: N/A;    No family history on file.  Social History:  reports that she has been smoking Cigarettes.  She has a 2.75 pack-year smoking history. She  does not have any smokeless tobacco history on file. She reports that she does not drink alcohol or use illicit drugs.  Allergies:  Allergies  Allergen Reactions  . Latex     Rash,  No severe allergy but gets rash for 24 hours, not breathing difficulties  . Codeine Other (See Comments)    Migraines and esophageal spasm     (Not in a hospital admission)  Review of Systems  Constitutional: Negative.  Negative for fever, chills and diaphoresis.  HENT: Negative for hearing loss and tinnitus.   Eyes: Negative.  Negative for blurred vision and discharge.  Respiratory: Negative.  Negative for cough, shortness of breath and wheezing.   Cardiovascular: Negative.  Negative for chest pain and palpitations.  Gastrointestinal: Negative for heartburn, nausea, vomiting, abdominal pain, diarrhea, constipation and blood in stool.  Genitourinary: Negative.  Negative for dysuria, urgency, frequency and hematuria.  Musculoskeletal: Negative.  Negative for myalgias and joint pain.  Skin: Negative.  Negative for rash.  Neurological: Negative.  Negative for dizziness, tingling, tremors, weakness and headaches.  Endo/Heme/Allergies: Negative.   Psychiatric/Behavioral: Negative.  Negative for depression and suicidal  ideas.    unknown if currently breastfeeding.  VSSAF Physical Exam  Constitutional: She appears well-developed and well-nourished.  HENT:  Head: Normocephalic and atraumatic.  Right Ear: External ear normal.  Left Ear: External ear normal.  Nose: Nose normal.  Mouth/Throat: Oropharynx is clear and moist. No oropharyngeal exudate.  Eyes: Conjunctivae normal and EOM are normal. Pupils are equal, round, and reactive to light. Right eye exhibits no discharge. Left eye exhibits no discharge.  Neck: Normal range of motion. Neck supple. No tracheal deviation present. No thyromegaly present.  Cardiovascular: Normal rate, regular rhythm and normal heart sounds.   Respiratory: Effort normal and  breath sounds normal. No respiratory distress. She has no wheezes. She has no rales.  GI: Soft. Bowel sounds are normal. She exhibits no distension and no mass. There is no tenderness. There is no rebound and no guarding.  Genitourinary: Vagina normal and uterus normal.  Musculoskeletal: Normal range of motion. She exhibits no edema.  Lymphadenopathy:    She has no cervical adenopathy.  Neurological: She is alert.  Skin: Skin is warm and dry.  Psychiatric: She has a normal mood and affect. Her behavior is normal. Judgment and thought content normal.    No results found for this or any previous visit (from the past 24 hour(s)).  No results found.  Assessment/Plan: Desires sterilization  Laparoscopic sterilization  Macky Galik H. 03/18/2012, 12:38 PM

## 2012-04-02 ENCOUNTER — Emergency Department (HOSPITAL_BASED_OUTPATIENT_CLINIC_OR_DEPARTMENT_OTHER)
Admission: EM | Admit: 2012-04-02 | Discharge: 2012-04-02 | Disposition: A | Discharge status: Home / Self Care | Payer: Medicaid Other | Attending: Emergency Medicine | Admitting: Emergency Medicine

## 2012-04-02 ENCOUNTER — Encounter: Payer: Self-pay | Admitting: Emergency Medicine

## 2012-04-02 DIAGNOSIS — K219 Gastro-esophageal reflux disease without esophagitis: Secondary | ICD-10-CM

## 2012-04-02 MED ORDER — GI COCKTAIL ~~LOC~~
30.0000 mL | Freq: Once | ORAL | Status: AC
  Administered 2012-04-02: 30 mL via ORAL

## 2012-04-02 MED ORDER — GI COCKTAIL ~~LOC~~: 30.0000 mL | Freq: Once | ORAL | Status: DC

## 2012-04-02 NOTE — ED Provider Notes (Signed)
History     CSN: [id number]  Arrival date & time 04/02/12  0800   None     Chief Complaint  Patient presents with  . Gastrophageal Reflux    (Consider location/radiation/quality/duration/timing/severity/associated sxs/prior treatment) Patient is a 31 y.o. female presenting with GERD. The history is provided by the patient.  Gastrophageal Reflux This is a new problem. Episode onset: Pt ate a taco yesterday, and took a Percocet this morning, and this seemed to bring on an episode of esophageal spasm.  She tried an Aloe liquid and a Zegerid OTC, and has had mild relief with that. The problem occurs constantly. The problem has been gradually improving. Associated symptoms include chest pain and abdominal pain. Nothing aggravates the symptoms. The symptoms are relieved by medications. Treatments tried: Aloe liquid, Zegerid OTC. The treatment provided mild relief.    Past Medical History  Diagnosis Date  . Back pain   . Chronic neck pain   . Chronic back pain   . Migraine   . Carpal tunnel syndrome on both sides   . Esophageal spasm     uses aloe vera with success  . GERD (gastroesophageal reflux disease)     Zantac  . Breastfeeding (infant)     Past Surgical History  Procedure Date  . Tonsillectomy   . Cholecystectomy   . Cesarean section 2007  . Dilation and curettage of uterus   . Cesarean section 01/27/2012    Procedure: CESAREAN SECTION;  Surgeon: Gabriela Harness, MD;  Location: [facility];  Service: Gynecology;  Laterality: N/A;    No family history on file.  History  Substance Use Topics  . Smoking status: Current Every Day Smoker -- 0.2 packs/day for 11 years    Types: Cigarettes  . Smokeless tobacco: Not on file  . Alcohol Use: No    OB History    Grav Para Term Preterm Abortions TAB SAB Ect Mult Living   3 2 2  1  1   2       Review of Systems  Constitutional: Negative.  Negative for fever and chills.  HENT: Negative.   Eyes: Negative.   Respiratory:  Negative.   Cardiovascular: Positive for chest pain.       Burning pain in substernal region.  Gastrointestinal: Positive for abdominal pain.       Epigastric pain.   Genitourinary: Negative.        Pt is breast feeding.  She had C-section 9 weeks ago.  Musculoskeletal: Negative.   Skin: Negative.   Neurological: Negative.   Hematological: Negative for adenopathy.  Psychiatric/Behavioral: Negative.     Allergies  Latex and Codeine  Home Medications   Current Outpatient Rx  Name Route Sig Dispense Refill  . ALOE VERA JUICE PO Oral Take 1 application by mouth as needed.    . COMPLETE NUTRITION PLUS PO Oral Take 1 application by mouth as needed.    Marland Kitchen OMEPRAZOLE-SODIUM BICARBONATE 40-1100 MG PO CAPS Oral Take 1 capsule by mouth as needed.    . OXYCODONE-ACETAMINOPHEN 5-325 MG PO TABS Oral Take 1 tablet by mouth every 4 (four) hours as needed.      BP 104/58  Pulse 76  Temp 97.5 F (36.4 C) (Oral)  Resp 22  SpO2 100%  Breastfeeding? Yes  Physical Exam  Nursing note and vitals reviewed. Constitutional: She is oriented to person, place, and time. She appears well-developed and well-nourished.       In moderate distress with substernal pain.  HENT:  Head: Normocephalic and atraumatic.  Right Ear: External ear normal.  Left Ear: External ear normal.  Mouth/Throat: Oropharynx is clear and moist.  Eyes: Conjunctivae normal and EOM are normal. Pupils are equal, round, and reactive to light.  Neck: Normal range of motion. Neck supple.  Cardiovascular: Normal rate, regular rhythm and normal heart sounds.   Pulmonary/Chest: Effort normal and breath sounds normal.  Abdominal: Soft.       Mild epigastric tenderness.  Musculoskeletal: Normal range of motion.  Neurological: She is alert and oriented to person, place, and time.       No sensory or motor deficit.  Skin: Skin is warm and dry.  Psychiatric: She has a normal mood and affect. Her behavior is normal.    ED Course    Procedures (including critical care time)  9:06 AM Pt was seen and had physical exam.  GI cocktail was ordered.   9:32 AM Pt feels much better.  Advised to avoid spicy foods, aspirin, take Zegerid once a day.   1. GERD (gastroesophageal reflux disease)        Carleene Cooper III, MD 04/02/12 [phone]

## 2012-04-02 NOTE — ED Notes (Signed)
Pt states she is having some esophageal spasms since this am.  Pt taking OTC juices with Aloe which hasnt helped yet.  Pt related standing helps more than anything else.

## 2012-05-21 ENCOUNTER — Encounter (HOSPITAL_BASED_OUTPATIENT_CLINIC_OR_DEPARTMENT_OTHER): Payer: Self-pay | Admitting: *Deleted

## 2012-05-21 ENCOUNTER — Emergency Department (HOSPITAL_BASED_OUTPATIENT_CLINIC_OR_DEPARTMENT_OTHER)
Admission: EM | Admit: 2012-05-21 | Discharge: 2012-05-21 | Disposition: A | Discharge status: Home / Self Care | Payer: Medicaid Other | Attending: Emergency Medicine | Admitting: Emergency Medicine

## 2012-05-21 DIAGNOSIS — G8929 Other chronic pain: Secondary | ICD-10-CM | POA: Insufficient documentation

## 2012-05-21 DIAGNOSIS — K219 Gastro-esophageal reflux disease without esophagitis: Secondary | ICD-10-CM | POA: Insufficient documentation

## 2012-05-21 DIAGNOSIS — M549 Dorsalgia, unspecified: Secondary | ICD-10-CM | POA: Insufficient documentation

## 2012-05-21 DIAGNOSIS — T8131XA Disruption of external operation (surgical) wound, not elsewhere classified, initial encounter: Secondary | ICD-10-CM | POA: Insufficient documentation

## 2012-05-21 DIAGNOSIS — F17200 Nicotine dependence, unspecified, uncomplicated: Secondary | ICD-10-CM | POA: Insufficient documentation

## 2012-05-21 DIAGNOSIS — Z8669 Personal history of other diseases of the nervous system and sense organs: Secondary | ICD-10-CM | POA: Insufficient documentation

## 2012-05-21 DIAGNOSIS — K224 Dyskinesia of esophagus: Secondary | ICD-10-CM | POA: Insufficient documentation

## 2012-05-21 DIAGNOSIS — Y838 Other surgical procedures as the cause of abnormal reaction of the patient, or of later complication, without mention of misadventure at the time of the procedure: Secondary | ICD-10-CM | POA: Insufficient documentation

## 2012-05-21 DIAGNOSIS — Z79899 Other long term (current) drug therapy: Secondary | ICD-10-CM | POA: Insufficient documentation

## 2012-05-21 DIAGNOSIS — M542 Cervicalgia: Secondary | ICD-10-CM | POA: Insufficient documentation

## 2012-05-21 DIAGNOSIS — G43909 Migraine, unspecified, not intractable, without status migrainosus: Secondary | ICD-10-CM | POA: Insufficient documentation

## 2012-05-21 MED ORDER — CEPHALEXIN 500 MG PO CAPS: 500.0000 mg | ORAL_CAPSULE | Freq: Four times a day (QID) | ORAL | Status: DC

## 2012-05-21 NOTE — ED Notes (Signed)
Pt states she had a c-section July 31 and was having problems with the site. Saw OB on Friday "stitch poking through-he dug it out, glued and stitched it, but the stitch came out and now it looks infected."

## 2012-05-21 NOTE — ED Provider Notes (Signed)
History  This chart was scribed for Gabriela Channell Smitty Cords, MD by Gabriela Benitez, ED Scribe. The patient was seen in room MH04/MH04. Patient's care was started at 2104.   CSN: [id number]  Arrival date & time 05/21/12  2044   First MD Initiated Contact with Patient 05/21/12 2104      Chief Complaint  Patient presents with  . Post-op Problem     The history is provided by the patient. No language interpreter was used.    HPI Comments: Gabriela Benitez is a 31 y.o. female who presents to the Emergency Department complaining of purulent drainage with possible infection from a post-op incision site to left lower abdomen onset 1 day ago. Patient denies pain, fever or diarrhea. Patient states that she had a C-section on January 27, 2012. Five days ago, she noticed that the stitch popped open. She states that she returned to her OB who removed the stitch, glued it and applied a new one. Yesterday, patient noticed that the same stitch popped and that it now looks infected with drainage from the site. No f/c/r. No n/v/d  Her next appointment with Dr. [name] is 3 weeks from now. Patient is allergic to codeine. Patient has a medical history of GERD, back pain and neck pain.  OB - Fil[facility] Berthold ([facility], [address], [place] [place])   Past Medical History  Diagnosis Date  . Back pain   . Chronic neck pain   . Chronic back pain   . Migraine   . Carpal tunnel syndrome on both sides   . Esophageal spasm     uses aloe vera with success  . GERD (gastroesophageal reflux disease)     Zantac  . Breastfeeding (infant)     Past Surgical History  Procedure Date  . Tonsillectomy   . Cholecystectomy   . Cesarean section 2007  . Dilation and curettage of uterus   . Cesarean section 01/27/2012    Procedure: CESAREAN SECTION;  Surgeon: Delbert Harness, MD;  Location: [facility];  Service: Gynecology;  Laterality: N/A;    History reviewed. No pertinent family history.  History  Substance Use  Topics  . Smoking status: Current Every Day Smoker -- 0.2 packs/day for 11 years    Types: Cigarettes  . Smokeless tobacco: Not on file  . Alcohol Use: No    OB History    Grav Para Term Preterm Abortions TAB SAB Ect Mult Living   3 2 2  1  1   2       Review of Systems  Constitutional: Negative for fever.  Respiratory: Negative for shortness of breath.   Cardiovascular: Negative for chest pain.  Gastrointestinal: Negative for diarrhea.  Skin: Positive for wound.  All other systems reviewed and are negative.    Allergies  Latex and Codeine  Home Medications   Current Outpatient Rx  Name  Route  Sig  Dispense  Refill  . ZANTAC PO   Oral   Take by mouth.         Marland Kitchen ULTRAM PO   Oral   Take by mouth.         . ALOE VERA JUICE PO   Oral   Take 1 application by mouth as needed.         . COMPLETE NUTRITION PLUS PO   Oral   Take 1 application by mouth as needed.         Marland Kitchen OMEPRAZOLE-SODIUM BICARBONATE 40-1100 MG PO CAPS  Oral   Take 1 capsule by mouth as needed.         . OXYCODONE-ACETAMINOPHEN 5-325 MG PO TABS   Oral   Take 1 tablet by mouth every 4 (four) hours as needed.           Triage Vitals: BP 114/60  Pulse 68  Temp 97.9 F (36.6 C) (Oral)  Resp 20  Ht 5\' 2"  (1.575 m)  Wt 183 lb (83.008 kg)  BMI 33.47 kg/m2  SpO2 100%  Breastfeeding? Yes  Physical Exam  Constitutional: She is oriented to person, place, and time. She appears well-developed and well-nourished. No distress.  HENT:  Head: Normocephalic and atraumatic.  Mouth/Throat: Oropharynx is clear and moist.  Eyes: EOM are normal. Pupils are equal, round, and reactive to light.  Neck: Normal range of motion.  Cardiovascular: Normal rate and regular rhythm.   No murmur heard. Pulmonary/Chest: Effort normal and breath sounds normal. No respiratory distress. She has no wheezes. She has no rales.  Abdominal: Soft. Bowel sounds are normal. She exhibits no distension. There is no  tenderness. There is no rigidity, no rebound, no guarding, no tenderness at McBurney's point and negative Murphy's sign.         Less than 1 cm slightly open incision with scant purulent drainage to lower right third of the transverse lower abdominal area.  Musculoskeletal: Normal range of motion.  Neurological: She is alert and oriented to person, place, and time. She displays normal reflexes.  Skin: Skin is warm and dry. No rash noted.  Psychiatric: She has a normal mood and affect. Her behavior is normal.    ED Course  Procedures (including critical care time) DIAGNOSTIC STUDIES: Oxygen Saturation is 100% on room air, normal by my interpretation.    COORDINATION OF CARE: 9:13 PM- Patient informed of current plan for treatment and evaluation and agrees with plan at this time.   9:16 PM- Spoke with Dr. [name]. Dr. [name] stated that area was cauterized with silver nitrate, a stitch and dermabond were applied to the area 5 days ago. Area is not cellulitic. Informed attending that patient will be started on Keflex and I have instructed patient to follow up with him at the beginning of the week. Dr. [name] agrees with plan.    No diagnosis found.    MDM  Wound care and irrigation provided with bacitracin. Continue bacitracin and wound cleaning at home. Take Keflex QID for 7 days. Follow up with Dr. [name] on Monday.  Patient verbalizes understanding and agrees to follow up   I personally performed the services described in this documentation, which was scribed in my presence. The recorded information has been reviewed and is accurate.     Jasmine Awe, MD 05/21/12 2204

## 2012-05-21 NOTE — ED Notes (Signed)
MD at bedside. 

## 2012-07-02 ENCOUNTER — Encounter (HOSPITAL_BASED_OUTPATIENT_CLINIC_OR_DEPARTMENT_OTHER): Payer: Self-pay | Admitting: *Deleted

## 2012-07-02 ENCOUNTER — Emergency Department (HOSPITAL_BASED_OUTPATIENT_CLINIC_OR_DEPARTMENT_OTHER)
Admission: EM | Admit: 2012-07-02 | Discharge: 2012-07-02 | Disposition: A | Discharge status: Home / Self Care | Payer: Medicaid Other | Attending: Emergency Medicine | Admitting: Emergency Medicine

## 2012-07-02 DIAGNOSIS — G43909 Migraine, unspecified, not intractable, without status migrainosus: Secondary | ICD-10-CM | POA: Insufficient documentation

## 2012-07-02 DIAGNOSIS — M542 Cervicalgia: Secondary | ICD-10-CM | POA: Insufficient documentation

## 2012-07-02 DIAGNOSIS — Z79899 Other long term (current) drug therapy: Secondary | ICD-10-CM | POA: Insufficient documentation

## 2012-07-02 DIAGNOSIS — M549 Dorsalgia, unspecified: Secondary | ICD-10-CM | POA: Insufficient documentation

## 2012-07-02 DIAGNOSIS — Z87891 Personal history of nicotine dependence: Secondary | ICD-10-CM | POA: Insufficient documentation

## 2012-07-02 DIAGNOSIS — Z8739 Personal history of other diseases of the musculoskeletal system and connective tissue: Secondary | ICD-10-CM | POA: Insufficient documentation

## 2012-07-02 DIAGNOSIS — G8929 Other chronic pain: Secondary | ICD-10-CM | POA: Insufficient documentation

## 2012-07-02 DIAGNOSIS — K219 Gastro-esophageal reflux disease without esophagitis: Secondary | ICD-10-CM | POA: Insufficient documentation

## 2012-07-02 MED ORDER — SODIUM CHLORIDE 0.9 % IV BOLUS (SEPSIS)
1000.0000 mL | Freq: Once | INTRAVENOUS | Status: AC
  Administered 2012-07-02: 1000 mL via INTRAVENOUS

## 2012-07-02 MED ORDER — METOCLOPRAMIDE HCL 10 MG PO TABS: 10.0000 mg | ORAL_TABLET | Freq: Four times a day (QID) | ORAL | Status: DC | PRN

## 2012-07-02 MED ORDER — SODIUM CHLORIDE 0.9 % IV SOLN: Freq: Once | INTRAVENOUS | Status: DC

## 2012-07-02 MED ORDER — DIPHENHYDRAMINE HCL 50 MG/ML IJ SOLN
25.0000 mg | Freq: Once | INTRAMUSCULAR | Status: AC
  Administered 2012-07-02: 25 mg via INTRAVENOUS
  Filled 2012-07-02: qty 1

## 2012-07-02 MED ORDER — FENTANYL CITRATE 0.05 MG/ML IJ SOLN
50.0000 ug | Freq: Once | INTRAMUSCULAR | Status: AC
  Administered 2012-07-02: 50 ug via INTRAVENOUS
  Filled 2012-07-02: qty 2

## 2012-07-02 MED ORDER — METOCLOPRAMIDE HCL 5 MG/ML IJ SOLN
10.0000 mg | Freq: Once | INTRAMUSCULAR | Status: AC
  Administered 2012-07-02: 10 mg via INTRAVENOUS
  Filled 2012-07-02: qty 2

## 2012-07-02 NOTE — ED Notes (Signed)
Pt c/o " migraine " x 16 hrs

## 2012-07-02 NOTE — ED Notes (Signed)
Entered room to find pt standing at the end of stretcher looking thru her purse-lights off and eating fritos-lights turned on for assement-pt put sunglasses back on-asked if pt felt better pt reports "not much"-advised she must feel better if able to tolerate fritos-states nausea is better-NAD notes

## 2012-07-02 NOTE — ED Provider Notes (Signed)
History     CSN: [id number]  Arrival date & time 07/02/12  1952   First MD Initiated Contact with Patient 07/02/12 2034      Chief Complaint  Patient presents with  . Migraine    (Consider location/radiation/quality/duration/timing/severity/associated sxs/prior treatment) Patient is a 32 y.o. female presenting with migraines. The history is provided by the patient.  Migraine  She had onset at 4 AM of a severe, left frontoparietal headache. Headache is dull and throbbing and typical of her migraines. She rates the pain at 8/10. It is worse with walking and worse with exposure to light and noise. Nothing makes it any better. There is associated nausea with no vomiting. She denies visual disturbance. Tried a variety of nonprescription treatments with narrow relief. Of note, she is has a 24-month-old child and is breast-feeding.  Past Medical History  Diagnosis Date  . Back pain   . Chronic neck pain   . Chronic back pain   . Migraine   . Carpal tunnel syndrome on both sides   . Esophageal spasm     uses aloe vera with success  . GERD (gastroesophageal reflux disease)     Zantac  . Breastfeeding (infant)     Past Surgical History  Procedure Date  . Tonsillectomy   . Cholecystectomy   . Cesarean section 2007  . Dilation and curettage of uterus   . Cesarean section 01/27/2012    Procedure: CESAREAN SECTION;  Surgeon: Delbert Harness, MD;  Location: [facility];  Service: Gynecology;  Laterality: N/A;    History reviewed. No pertinent family history.  History  Substance Use Topics  . Smoking status: Former Smoker -- 0.2 packs/day for 11 years    Types: Cigarettes  . Smokeless tobacco: Not on file  . Alcohol Use: No    OB History    Grav Para Term Preterm Abortions TAB SAB Ect Mult Living   3 2 2  1  1   2       Review of Systems  All other systems reviewed and are negative.    Allergies  Latex and Codeine  Home Medications   Current Outpatient Rx  Name  Route   Sig  Dispense  Refill  . ZANTAC PO   Oral   Take by mouth.         . ALOE VERA JUICE PO   Oral   Take 1 application by mouth as needed.         . CEPHALEXIN 500 MG PO CAPS   Oral   Take 1 capsule (500 mg total) by mouth 4 (four) times daily.   28 capsule   0   . COMPLETE NUTRITION PLUS PO   Oral   Take 1 application by mouth as needed.         Marland Kitchen OMEPRAZOLE-SODIUM BICARBONATE 40-1100 MG PO CAPS   Oral   Take 1 capsule by mouth as needed.         . OXYCODONE-ACETAMINOPHEN 5-325 MG PO TABS   Oral   Take 1 tablet by mouth every 4 (four) hours as needed.         Marland Kitchen ZANTAC PO   Oral   Take by mouth.         Marland Kitchen ULTRAM PO   Oral   Take by mouth.           BP 113/67  Pulse 83  Temp 98.6 F (37 C) (Oral)  Resp 16  Ht 5\' 4"  (  1.626 m)  Wt 185 lb (83.915 kg)  BMI 31.76 kg/m2  SpO2 100%  Physical Exam  Nursing note and vitals reviewed. 32 year old female, who appears uncomfortable and is wearing dark glasses, but is in no acute distress. Vital signs are normal. Oxygen saturation is 100%, which is normal. Head is normocephalic and atraumatic. PERRLA, EOMI. Oropharynx is clear. Fundi show no hemorrhage, exudate, or papilledema. Neck is nontender and supple without adenopathy or JVD. Back is nontender and there is no CVA tenderness. Lungs are clear without rales, wheezes, or rhonchi. Chest is nontender. Heart has regular rate and rhythm without murmur. Abdomen is soft, flat, nontender without masses or hepatosplenomegaly and peristalsis is normoactive. Extremities have no cyanosis or edema, full range of motion is present. Skin is warm and dry without rash. Neurologic: Mental status is normal, cranial nerves are intact, there are no motor or sensory deficits.   ED Course  Procedures (including critical care time)   1. Migraine       MDM  Headache which seems to have full of migraines. She will be treated with IV fluids, IV metoclopramide, and IV  diphenhydramine. Old records are reviewed, and she had been seen here about one year ago for migraines which time she was pregnant. She states that she is on a Depo-Provera injection now for contraception.  She got significant relief of her headache with the above-noted medication. Headache was down to 3/10. She's given a dose of fentanyl with further reduction of her headache and she is discharged with a prescription for metoclopramide. Given that she is currently lactating, she is advised to pump her breasts and discard the milk for the next 24 hours.   Dione Booze, MD 07/02/12 [phone]

## 2012-11-15 ENCOUNTER — Emergency Department (HOSPITAL_BASED_OUTPATIENT_CLINIC_OR_DEPARTMENT_OTHER)
Admission: EM | Admit: 2012-11-15 | Discharge: 2012-11-15 | Disposition: A | Discharge status: Home / Self Care | Payer: Medicaid Other | Attending: Emergency Medicine | Admitting: Emergency Medicine

## 2012-11-15 ENCOUNTER — Emergency Department (HOSPITAL_BASED_OUTPATIENT_CLINIC_OR_DEPARTMENT_OTHER): Payer: Medicaid Other

## 2012-11-15 ENCOUNTER — Encounter (HOSPITAL_BASED_OUTPATIENT_CLINIC_OR_DEPARTMENT_OTHER): Payer: Self-pay | Admitting: Family Medicine

## 2012-11-15 DIAGNOSIS — M545 Low back pain, unspecified: Secondary | ICD-10-CM | POA: Insufficient documentation

## 2012-11-15 DIAGNOSIS — Z8719 Personal history of other diseases of the digestive system: Secondary | ICD-10-CM | POA: Insufficient documentation

## 2012-11-15 DIAGNOSIS — Z87891 Personal history of nicotine dependence: Secondary | ICD-10-CM | POA: Insufficient documentation

## 2012-11-15 DIAGNOSIS — Z3202 Encounter for pregnancy test, result negative: Secondary | ICD-10-CM | POA: Insufficient documentation

## 2012-11-15 DIAGNOSIS — G43909 Migraine, unspecified, not intractable, without status migrainosus: Secondary | ICD-10-CM | POA: Insufficient documentation

## 2012-11-15 DIAGNOSIS — Z8739 Personal history of other diseases of the musculoskeletal system and connective tissue: Secondary | ICD-10-CM | POA: Insufficient documentation

## 2012-11-15 DIAGNOSIS — Z79899 Other long term (current) drug therapy: Secondary | ICD-10-CM | POA: Insufficient documentation

## 2012-11-15 DIAGNOSIS — K219 Gastro-esophageal reflux disease without esophagitis: Secondary | ICD-10-CM | POA: Insufficient documentation

## 2012-11-15 DIAGNOSIS — Z91040 Latex allergy status: Secondary | ICD-10-CM | POA: Insufficient documentation

## 2012-11-15 DIAGNOSIS — M533 Sacrococcygeal disorders, not elsewhere classified: Secondary | ICD-10-CM

## 2012-11-15 DIAGNOSIS — G8929 Other chronic pain: Secondary | ICD-10-CM | POA: Insufficient documentation

## 2012-11-15 DIAGNOSIS — Z8669 Personal history of other diseases of the nervous system and sense organs: Secondary | ICD-10-CM | POA: Insufficient documentation

## 2012-11-15 LAB — URINALYSIS, ROUTINE W REFLEX MICROSCOPIC
Hgb urine dipstick: NEGATIVE
Leukocytes, UA: NEGATIVE
Specific Gravity, Urine: 1.015 (ref 1.005–1.030)
Urobilinogen, UA: 0.2 mg/dL (ref 0.0–1.0)

## 2012-11-15 LAB — PREGNANCY, URINE: Preg Test, Ur: NEGATIVE

## 2012-11-15 NOTE — ED Provider Notes (Signed)
History     CSN: [id number]  Arrival date & time 11/15/12  1038   First MD Initiated Contact with Patient 11/15/12 1103      Chief Complaint  Patient presents with  . Tailbone Pain    (Consider location/radiation/quality/duration/timing/severity/associated sxs/prior treatment) HPI Comments: Low back pain since yesterday without injury. Patient states she been picking up her child and wonders if she pulled her back. She denies any falls or previous back problems. States she cannot take anti-inflammatories or any pain except Tylenol. Denies any abdominal pain, weakness, numbness, tingling, bowel bladder incontinence, fevers or vomiting. No history of cancer or illicit drug use.  The history is provided by the patient.    Past Medical History  Diagnosis Date  . Back pain   . Chronic neck pain   . Chronic back pain   . Migraine   . Carpal tunnel syndrome on both sides   . Esophageal spasm     uses aloe vera with success  . GERD (gastroesophageal reflux disease)     Zantac  . Breastfeeding (infant)     Past Surgical History  Procedure Laterality Date  . Tonsillectomy    . Cholecystectomy    . Cesarean section  2007  . Dilation and curettage of uterus    . Cesarean section  01/27/2012    Procedure: CESAREAN SECTION;  Surgeon: Delbert Harness, MD;  Location: [facility];  Service: Gynecology;  Laterality: N/A;    No family history on file.  History  Substance Use Topics  . Smoking status: Former Smoker -- 0.25 packs/day for 11 years    Types: Cigarettes  . Smokeless tobacco: Not on file  . Alcohol Use: No    OB History   Grav Para Term Preterm Abortions TAB SAB Ect Mult Living   3 2 2  1  1   2       Review of Systems  Constitutional: Negative for activity change and appetite change.  Respiratory: Negative for cough and shortness of breath.   Gastrointestinal: Negative for vomiting and abdominal pain.  Genitourinary: Negative for dysuria and hematuria.   Musculoskeletal: Positive for back pain.  A complete 10 system review of systems was obtained and all systems are negative except as noted in the HPI and PMH.    Allergies  Latex; Nsaids; Tramadol; and Codeine  Home Medications   Current Outpatient Rx  Name  Route  Sig  Dispense  Refill  . ALOE VERA JUICE PO   Oral   Take 1 application by mouth as needed.         . cephALEXin (KEFLEX) 500 MG capsule   Oral   Take 1 capsule (500 mg total) by mouth 4 (four) times daily.   28 capsule   0   . metoCLOPramide (REGLAN) 10 MG tablet   Oral   Take 1 tablet (10 mg total) by mouth every 6 (six) hours as needed (nausea or headache).   30 tablet   0   . Nutritional Supplements (COMPLETE NUTRITION PLUS PO)   Oral   Take 1 application by mouth as needed.         Marland Kitchen omeprazole-sodium bicarbonate (ZEGERID) 40-1100 MG per capsule   Oral   Take 1 capsule by mouth as needed.         Marland Kitchen oxyCODONE-acetaminophen (PERCOCET/ROXICET) 5-325 MG per tablet   Oral   Take 1 tablet by mouth every 4 (four) hours as needed.         Marland Kitchen  Ranitidine HCl (ZANTAC PO)   Oral   Take by mouth.         . Ranitidine HCl (ZANTAC PO)   Oral   Take by mouth.         . TraMADol HCl (ULTRAM PO)   Oral   Take by mouth.           BP 107/77  Pulse 60  Temp(Src) 98.4 F (36.9 C) (Oral)  Resp 18  SpO2 97%  Breastfeeding? Yes  Physical Exam  Constitutional: She is oriented to person, place, and time. She appears well-developed and well-nourished. No distress.  HENT:  Head: Normocephalic and atraumatic.  Mouth/Throat: Oropharynx is clear and moist. No oropharyngeal exudate.  Eyes: Conjunctivae and EOM are normal. Pupils are equal, round, and reactive to light.  Neck: Normal range of motion. Neck supple.  Cardiovascular: Normal rate, regular rhythm and normal heart sounds.   No murmur heard. Pulmonary/Chest: Effort normal and breath sounds normal. No respiratory distress.  Abdominal: Soft.  There is no tenderness. There is no rebound and no guarding.  Musculoskeletal: Normal range of motion. She exhibits tenderness. She exhibits no edema.  TTP sacrum and lumbar spine in midline.   Neurological: She is alert and oriented to person, place, and time. No cranial nerve deficit. She exhibits normal muscle tone. Coordination normal.  5/5 strength in bilateral lower extremities. Ankle plantar and dorsiflexion intact. Great toe extension intact bilaterally. +2 DP and PT pulses. +2 patellar reflexes bilaterally. Normal gait.   Skin: Skin is warm.    ED Course  Procedures (including critical care time)  Labs Reviewed  URINALYSIS, ROUTINE W REFLEX MICROSCOPIC  PREGNANCY, URINE   Dg Sacrum/coccyx  11/15/2012   *RADIOLOGY REPORT*  Clinical Data: Pain  SACRUM AND COCCYX - 2+ VIEW  Comparison: None.  Findings: Frontal and lateral views were obtained.  No fracture or dislocation.  No apparent sacroiliac joint diastasis.  Joint spaces appear intact.  No erosive change.  IMPRESSION: No abnormality noted.   Original Report Authenticated By: Bretta Bang, M.D.     1. Sacral back pain       MDM  Back pain without fall or trauma.  No weakness, numbness, tingling.    No neurological red flags. No signs of spinal cord injury.  Patient states he only medication she can take is Tylenol. Refuses any narcotics or NSAIDs. Stable for followup with her doctor.       Glynn Octave, MD 11/15/12 1537

## 2012-11-15 NOTE — ED Notes (Signed)
Pt c/o low back pain near tailbone since yesterday. Pt reports onset with picking up child. Pt sts she cannot take NSAIDS, can only take tylenol for pain and it did not relieve symptoms.

## 2013-03-01 ENCOUNTER — Ambulatory Visit: Payer: Medicaid Other | Admitting: Physical Therapy

## 2014-03-13 ENCOUNTER — Encounter (HOSPITAL_BASED_OUTPATIENT_CLINIC_OR_DEPARTMENT_OTHER): Payer: Self-pay | Admitting: Emergency Medicine

## 2014-03-13 ENCOUNTER — Emergency Department (HOSPITAL_BASED_OUTPATIENT_CLINIC_OR_DEPARTMENT_OTHER)
Admission: EM | Admit: 2014-03-13 | Discharge: 2014-03-13 | Disposition: A | Discharge status: Home / Self Care | Payer: Medicaid Other | Attending: Emergency Medicine | Admitting: Emergency Medicine

## 2014-03-13 DIAGNOSIS — Z792 Long term (current) use of antibiotics: Secondary | ICD-10-CM | POA: Insufficient documentation

## 2014-03-13 DIAGNOSIS — Z9089 Acquired absence of other organs: Secondary | ICD-10-CM | POA: Diagnosis not present

## 2014-03-13 DIAGNOSIS — K219 Gastro-esophageal reflux disease without esophagitis: Secondary | ICD-10-CM | POA: Diagnosis not present

## 2014-03-13 DIAGNOSIS — R109 Unspecified abdominal pain: Secondary | ICD-10-CM

## 2014-03-13 DIAGNOSIS — Z79899 Other long term (current) drug therapy: Secondary | ICD-10-CM | POA: Insufficient documentation

## 2014-03-13 DIAGNOSIS — F32A Depression, unspecified: Secondary | ICD-10-CM

## 2014-03-13 DIAGNOSIS — F329 Major depressive disorder, single episode, unspecified: Secondary | ICD-10-CM | POA: Diagnosis not present

## 2014-03-13 DIAGNOSIS — Z91040 Latex allergy status: Secondary | ICD-10-CM | POA: Diagnosis not present

## 2014-03-13 DIAGNOSIS — G43909 Migraine, unspecified, not intractable, without status migrainosus: Secondary | ICD-10-CM | POA: Insufficient documentation

## 2014-03-13 DIAGNOSIS — Z87891 Personal history of nicotine dependence: Secondary | ICD-10-CM | POA: Diagnosis not present

## 2014-03-13 DIAGNOSIS — R1013 Epigastric pain: Secondary | ICD-10-CM | POA: Insufficient documentation

## 2014-03-13 DIAGNOSIS — G8929 Other chronic pain: Secondary | ICD-10-CM | POA: Insufficient documentation

## 2014-03-13 DIAGNOSIS — Z9889 Other specified postprocedural states: Secondary | ICD-10-CM | POA: Insufficient documentation

## 2014-03-13 DIAGNOSIS — F3289 Other specified depressive episodes: Secondary | ICD-10-CM | POA: Insufficient documentation

## 2014-03-13 DIAGNOSIS — Z3202 Encounter for pregnancy test, result negative: Secondary | ICD-10-CM | POA: Diagnosis not present

## 2014-03-13 LAB — CBC WITH DIFFERENTIAL/PLATELET
Basophils Absolute: 0 10*3/uL (ref 0.0–0.1)
Basophils Relative: 0 % (ref 0–1)
Eosinophils Absolute: 0.1 10*3/uL (ref 0.0–0.7)
Eosinophils Relative: 1 % (ref 0–5)
HEMATOCRIT: 37.4 % (ref 36.0–46.0)
HEMOGLOBIN: 12.6 g/dL (ref 12.0–15.0)
LYMPHS ABS: 0.8 10*3/uL (ref 0.7–4.0)
LYMPHS PCT: 9 % — AB (ref 12–46)
MCH: 30.4 pg (ref 26.0–34.0)
MCHC: 33.7 g/dL (ref 30.0–36.0)
MCV: 90.1 fL (ref 78.0–100.0)
MONO ABS: 0.5 10*3/uL (ref 0.1–1.0)
MONOS PCT: 6 % (ref 3–12)
NEUTROS ABS: 7.2 10*3/uL (ref 1.7–7.7)
Neutrophils Relative %: 84 % — ABNORMAL HIGH (ref 43–77)
Platelets: 306 10*3/uL (ref 150–400)
RBC: 4.15 MIL/uL (ref 3.87–5.11)
RDW: 13.6 % (ref 11.5–15.5)
WBC: 8.6 10*3/uL (ref 4.0–10.5)

## 2014-03-13 LAB — URINALYSIS, ROUTINE W REFLEX MICROSCOPIC
BILIRUBIN URINE: NEGATIVE
GLUCOSE, UA: NEGATIVE mg/dL
HGB URINE DIPSTICK: NEGATIVE
Ketones, ur: NEGATIVE mg/dL
Leukocytes, UA: NEGATIVE
Nitrite: NEGATIVE
Protein, ur: NEGATIVE mg/dL
SPECIFIC GRAVITY, URINE: 1.025 (ref 1.005–1.030)
Urobilinogen, UA: 0.2 mg/dL (ref 0.0–1.0)
pH: 5.5 (ref 5.0–8.0)

## 2014-03-13 LAB — COMPREHENSIVE METABOLIC PANEL
ALT: 8 U/L (ref 0–35)
ANION GAP: 15 (ref 5–15)
AST: 12 U/L (ref 0–37)
Albumin: 3.9 g/dL (ref 3.5–5.2)
Alkaline Phosphatase: 88 U/L (ref 39–117)
BILIRUBIN TOTAL: 0.3 mg/dL (ref 0.3–1.2)
BUN: 13 mg/dL (ref 6–23)
CHLORIDE: 103 meq/L (ref 96–112)
CO2: 20 meq/L (ref 19–32)
CREATININE: 0.6 mg/dL (ref 0.50–1.10)
Calcium: 9.3 mg/dL (ref 8.4–10.5)
GFR calc Af Amer: >90 mL/min (ref >90)
GLUCOSE: 101 mg/dL — AB (ref 70–99)
Potassium: 4.1 mEq/L (ref 3.7–5.3)
Sodium: 138 mEq/L (ref 137–147)
Total Protein: 7.3 g/dL (ref 6.0–8.3)

## 2014-03-13 LAB — LIPASE, BLOOD: LIPASE: 27 U/L (ref 11–59)

## 2014-03-13 LAB — PREGNANCY, URINE: PREG TEST UR: NEGATIVE

## 2014-03-13 MED ORDER — ONDANSETRON 4 MG PO TBDP
4.0000 mg | ORAL_TABLET | Freq: Once | ORAL | Status: AC
  Administered 2014-03-13: 4 mg via ORAL
  Filled 2014-03-13: qty 1

## 2014-03-13 NOTE — ED Notes (Signed)
Epigastric pain for a month but it became worse over the past week. States she has been depressed. Thinks she is having side effects of medications.

## 2014-03-13 NOTE — Discharge Instructions (Signed)

## 2014-03-13 NOTE — ED Notes (Signed)
PA at bedside.

## 2014-03-13 NOTE — ED Provider Notes (Signed)
Medical screening examination/treatment/procedure(s) were performed by non-physician practitioner and as supervising physician I was immediately available for consultation/collaboration.   EKG Interpretation   Date/Time:  Tuesday March 13 2014 12:32:36 EDT Ventricular Rate:  93 PR Interval:  102 QRS Duration: 84 QT Interval:  348 QTC Calculation: 432 R Axis:   50 Text Interpretation:  Sinus rhythm with short PR Otherwise normal ECG  since last tracing no significant change Confirmed by Demontrez Rindfleisch  MD, Jacquelyn Shadrick  ([id number]) on 03/13/2014 12:43:49 PM        Rolan Bucco, MD 03/13/14 1450

## 2014-03-13 NOTE — ED Provider Notes (Signed)
CSN: [id number]     Arrival date & time 03/13/14  1212 History   First MD Initiated Contact with Patient 03/13/14 1223     Chief Complaint  Patient presents with  . epigastric pain      (Consider location/radiation/quality/duration/timing/severity/associated sxs/prior Treatment) HPI Comments: Pt states that she has been having epigastric pain for a month. Pt states that the pain worsened over the last week. Pt state that she has been taking topamax and she thinks the symptoms are related to that so she stopped taking it 5 days ago. Denies fever, vomiting, diarrhea, cp. Pt states that she was also questioning why she was here and just wanted to sleep. Pt states those symptoms have resolved since she got off the abdominal pain has not resolved  The history is provided by the patient. No language interpreter was used.    Past Medical History  Diagnosis Date  . Back pain   . Chronic neck pain   . Chronic back pain   . Migraine   . Carpal tunnel syndrome on both sides   . Esophageal spasm     uses aloe vera with success  . GERD (gastroesophageal reflux disease)     Zantac   Past Surgical History  Procedure Laterality Date  . Tonsillectomy    . Cholecystectomy    . Cesarean section  2007  . Dilation and curettage of uterus    . Cesarean section  01/27/2012    Procedure: CESAREAN SECTION;  Surgeon: Delbert Harness, MD;  Location: [facility];  Service: Gynecology;  Laterality: N/A;   No family history on file. History  Substance Use Topics  . Smoking status: Former Smoker -- 0.25 packs/day for 11 years    Types: Cigarettes  . Smokeless tobacco: Not on file  . Alcohol Use: No   OB History   Grav Para Term Preterm Abortions TAB SAB Ect Mult Living   Review of Systems  Constitutional: Negative.   Respiratory: Negative.   Cardiovascular: Negative.       Allergies  Latex; Nsaids; Tramadol; and Codeine  Home Medications   Prior to Admission medications    Medication Sig Start Date End Date Taking? Authorizing Provider  BuPROPion HCl (WELLBUTRIN PO) Take by mouth.   Yes Historical Provider, MD  Esomeprazole Magnesium (NEXIUM PO) Take by mouth.   Yes Historical Provider, MD  Lisdexamfetamine Dimesylate (VYVANSE PO) Take by mouth.   Yes Historical Provider, MD  Lurasidone HCl (LATUDA) 20 MG TABS Take 20 mg by mouth daily.   Yes Historical Provider, MD  Ranitidine HCl (ZANTAC PO) Take by mouth.   Yes Historical Provider, MD  ALOE VERA JUICE PO Take 1 application by mouth as needed.    Historical Provider, MD  cephALEXin (KEFLEX) 500 MG capsule Take 1 capsule (500 mg total) by mouth 4 (four) times daily. 05/21/12   April K Palumbo-Rasch, MD  metoCLOPramide (REGLAN) 10 MG tablet Take 1 tablet (10 mg total) by mouth every 6 (six) hours as needed (nausea or headache). 07/02/12   Dione Booze, MD  Nutritional Supplements (COMPLETE NUTRITION PLUS PO) Take 1 application by mouth as needed.    Historical Provider, MD  omeprazole-sodium bicarbonate (ZEGERID) 40-1100 MG per capsule Take 1 capsule by mouth as needed.    Historical Provider, MD  oxyCODONE-acetaminophen (PERCOCET/ROXICET) 5-325 MG per tablet Take 1 tablet by mouth every 4 (four) hours as needed.  Historical Provider, MD  Ranitidine HCl (ZANTAC PO) Take by mouth.    Historical Provider, MD  TraMADol HCl (ULTRAM PO) Take by mouth.    Historical Provider, MD   BP 118/71  Pulse 107  Temp(Src) 98.6 F (37 C) (Oral)  Resp 20  Ht  (1.6 m)  Wt 180 lb (81.647 kg)  BMI 31.89 kg/m2  SpO2 99% Physical Exam  Nursing note and vitals reviewed. Constitutional: She is oriented to person, place, and time. She appears well-developed and well-nourished.  HENT:  Right Ear: External ear normal.  Left Ear: External ear normal.  Cardiovascular: Normal rate and regular rhythm.   Pulmonary/Chest: Effort normal and breath sounds normal.  Abdominal: Soft. Bowel sounds are normal. There is no rebound.   Musculoskeletal: Normal range of motion.  Neurological: She is alert and oriented to person, place, and time.  Skin: Skin is warm and dry.  Psychiatric: She has a normal mood and affect.    ED Course  Procedures (including critical care time) Labs Review Labs Reviewed  CBC WITH DIFFERENTIAL - Abnormal; Notable for the following:    Neutrophils Relative % 84 (*)    Lymphocytes Relative 9 (*)    All other components within normal limits  COMPREHENSIVE METABOLIC PANEL - Abnormal; Notable for the following:    Glucose, Bld 101 (*)    All other components within normal limits  URINALYSIS, ROUTINE W REFLEX MICROSCOPIC  PREGNANCY, URINE  LIPASE, BLOOD    Imaging Review No results found.   EKG Interpretation   Date/Time:  Tuesday March 13 2014 12:32:36 EDT Ventricular Rate:  93 PR Interval:  102 QRS Duration: 84 QT Interval:  348 QTC Calculation: 432 R Axis:   50 Text Interpretation:  Sinus rhythm with short PR Otherwise normal ECG  since last tracing no significant change Confirmed by BELFI  MD, MELANIE  ([id number]) on 03/13/2014 12:43:49 PM      MDM   Final diagnoses:  Abdominal pain, unspecified abdominal location  Depression    Abdomen in benign on exam. No acute lab abnormality. Pt okay with plan to follow up with pcp tomorrow.    Teressa Lower, NP 03/13/14 1411

## 2014-03-13 NOTE — ED Notes (Signed)
Pt reports she has been on topamax for a month and had been having feelings of SI, decreased appetite, and depression until she stopped the medicine on Friday- states no longer feeling SI or depression since stopping med

## 2014-04-30 ENCOUNTER — Encounter (HOSPITAL_BASED_OUTPATIENT_CLINIC_OR_DEPARTMENT_OTHER): Payer: Self-pay | Admitting: Emergency Medicine

## 2015-05-19 ENCOUNTER — Emergency Department (HOSPITAL_COMMUNITY): Payer: Medicaid Other

## 2015-05-19 ENCOUNTER — Emergency Department (HOSPITAL_COMMUNITY)
Admission: EM | Admit: 2015-05-19 | Discharge: 2015-05-19 | Disposition: A | Discharge status: Home / Self Care | Payer: Medicaid Other | Attending: Emergency Medicine | Admitting: Emergency Medicine

## 2015-05-19 ENCOUNTER — Encounter (HOSPITAL_COMMUNITY): Payer: Self-pay | Admitting: Emergency Medicine

## 2015-05-19 DIAGNOSIS — F17200 Nicotine dependence, unspecified, uncomplicated: Secondary | ICD-10-CM | POA: Diagnosis not present

## 2015-05-19 DIAGNOSIS — K219 Gastro-esophageal reflux disease without esophagitis: Secondary | ICD-10-CM | POA: Insufficient documentation

## 2015-05-19 DIAGNOSIS — G43909 Migraine, unspecified, not intractable, without status migrainosus: Secondary | ICD-10-CM | POA: Insufficient documentation

## 2015-05-19 DIAGNOSIS — Z9889 Other specified postprocedural states: Secondary | ICD-10-CM | POA: Diagnosis not present

## 2015-05-19 DIAGNOSIS — Z91040 Latex allergy status: Secondary | ICD-10-CM | POA: Insufficient documentation

## 2015-05-19 DIAGNOSIS — Z79899 Other long term (current) drug therapy: Secondary | ICD-10-CM | POA: Insufficient documentation

## 2015-05-19 DIAGNOSIS — R1084 Generalized abdominal pain: Secondary | ICD-10-CM | POA: Diagnosis present

## 2015-05-19 DIAGNOSIS — Z9049 Acquired absence of other specified parts of digestive tract: Secondary | ICD-10-CM | POA: Diagnosis not present

## 2015-05-19 DIAGNOSIS — N12 Tubulo-interstitial nephritis, not specified as acute or chronic: Secondary | ICD-10-CM | POA: Diagnosis not present

## 2015-05-19 DIAGNOSIS — Z3202 Encounter for pregnancy test, result negative: Secondary | ICD-10-CM | POA: Insufficient documentation

## 2015-05-19 DIAGNOSIS — G8929 Other chronic pain: Secondary | ICD-10-CM | POA: Insufficient documentation

## 2015-05-19 DIAGNOSIS — R7989 Other specified abnormal findings of blood chemistry: Secondary | ICD-10-CM | POA: Diagnosis not present

## 2015-05-19 DIAGNOSIS — Z8739 Personal history of other diseases of the musculoskeletal system and connective tissue: Secondary | ICD-10-CM | POA: Insufficient documentation

## 2015-05-19 DIAGNOSIS — R945 Abnormal results of liver function studies: Secondary | ICD-10-CM

## 2015-05-19 DIAGNOSIS — R109 Unspecified abdominal pain: Secondary | ICD-10-CM

## 2015-05-19 LAB — URINALYSIS, ROUTINE W REFLEX MICROSCOPIC
BILIRUBIN URINE: NEGATIVE
Glucose, UA: NEGATIVE mg/dL
KETONES UR: NEGATIVE mg/dL
NITRITE: NEGATIVE
Protein, ur: 30 mg/dL — AB
Specific Gravity, Urine: 1.016 (ref 1.005–1.030)
pH: 6 (ref 5.0–8.0)

## 2015-05-19 LAB — COMPREHENSIVE METABOLIC PANEL
ALT: 79 U/L — ABNORMAL HIGH (ref 14–54)
AST: 59 U/L — ABNORMAL HIGH (ref 15–41)
Albumin: 3.3 g/dL — ABNORMAL LOW (ref 3.5–5.0)
Alkaline Phosphatase: 146 U/L — ABNORMAL HIGH (ref 38–126)
Anion gap: 7 (ref 5–15)
BUN: 7 mg/dL (ref 6–20)
CHLORIDE: 105 mmol/L (ref 101–111)
CO2: 25 mmol/L (ref 22–32)
CREATININE: 0.65 mg/dL (ref 0.44–1.00)
Calcium: 9.2 mg/dL (ref 8.9–10.3)
Glucose, Bld: 98 mg/dL (ref 65–99)
POTASSIUM: 4 mmol/L (ref 3.5–5.1)
Sodium: 137 mmol/L (ref 135–145)
Total Bilirubin: 0.3 mg/dL (ref 0.3–1.2)
Total Protein: 7.1 g/dL (ref 6.5–8.1)

## 2015-05-19 LAB — URINE MICROSCOPIC-ADD ON

## 2015-05-19 LAB — CBC
HEMATOCRIT: 39.5 % (ref 36.0–46.0)
Hemoglobin: 13.1 g/dL (ref 12.0–15.0)
MCH: 30 pg (ref 26.0–34.0)
MCHC: 33.2 g/dL (ref 30.0–36.0)
MCV: 90.6 fL (ref 78.0–100.0)
Platelets: 318 10*3/uL (ref 150–400)
RBC: 4.36 MIL/uL (ref 3.87–5.11)
RDW: 12.8 % (ref 11.5–15.5)
WBC: 10.6 10*3/uL — AB (ref 4.0–10.5)

## 2015-05-19 LAB — POC URINE PREG, ED: PREG TEST UR: NEGATIVE

## 2015-05-19 LAB — LIPASE, BLOOD: LIPASE: 21 U/L (ref 11–51)

## 2015-05-19 MED ORDER — TRAMADOL HCL 50 MG PO TABS: 50.0000 mg | ORAL_TABLET | Freq: Four times a day (QID) | ORAL | Status: DC | PRN

## 2015-05-19 MED ORDER — KETOROLAC TROMETHAMINE 15 MG/ML IJ SOLN
15.0000 mg | Freq: Once | INTRAMUSCULAR | Status: AC
  Administered 2015-05-19: 15 mg via INTRAVENOUS
  Filled 2015-05-19: qty 1

## 2015-05-19 MED ORDER — DEXTROSE 5 % IV SOLN
1.0000 g | Freq: Once | INTRAVENOUS | Status: AC
  Administered 2015-05-19: 1 g via INTRAVENOUS
  Filled 2015-05-19: qty 10

## 2015-05-19 MED ORDER — CEPHALEXIN 500 MG PO CAPS: 500.0000 mg | ORAL_CAPSULE | Freq: Three times a day (TID) | ORAL | Status: DC

## 2015-05-19 MED ORDER — ONDANSETRON 4 MG PO TBDP: 4.0000 mg | ORAL_TABLET | Freq: Three times a day (TID) | ORAL | Status: DC | PRN

## 2015-05-19 MED ORDER — SODIUM CHLORIDE 0.9 % IV BOLUS (SEPSIS)
1000.0000 mL | Freq: Once | INTRAVENOUS | Status: AC
  Administered 2015-05-19: 1000 mL via INTRAVENOUS

## 2015-05-19 MED ORDER — ONDANSETRON HCL 4 MG/2ML IJ SOLN
4.0000 mg | Freq: Once | INTRAMUSCULAR | Status: AC
  Administered 2015-05-19: 4 mg via INTRAVENOUS
  Filled 2015-05-19: qty 2

## 2015-05-19 MED ORDER — FENTANYL CITRATE (PF) 100 MCG/2ML IJ SOLN
50.0000 ug | Freq: Once | INTRAMUSCULAR | Status: AC
  Administered 2015-05-19: 50 ug via INTRAVENOUS
  Filled 2015-05-19: qty 2

## 2015-05-19 MED ORDER — FENTANYL CITRATE (PF) 100 MCG/2ML IJ SOLN
100.0000 ug | Freq: Once | INTRAMUSCULAR | Status: AC
  Administered 2015-05-19: 100 ug via INTRAVENOUS
  Filled 2015-05-19: qty 2

## 2015-05-19 NOTE — ED Provider Notes (Signed)
CSN: [id number]     Arrival date & time 05/19/15  1540 History   First MD Initiated Contact with Patient 05/19/15 1652     Chief Complaint  Patient presents with  . Flank Pain  . Abdominal Pain   Patient is a 34 y.o. female presenting with abdominal pain. The history is provided by the patient.  Abdominal Pain Pain location:  Generalized Pain radiates to:  Groin, L flank and R flank Pain severity:  Severe Onset quality:  Gradual Duration:  1 week Timing:  Constant Chronicity:  New Relieved by:  Nothing Ineffective treatments: cipro. Associated symptoms: fever and nausea   Associated symptoms: no chest pain, no chills, no constipation, no dysuria, no hematuria, no shortness of breath, no sore throat, no vaginal bleeding, no vaginal discharge and no vomiting     Past Medical History  Diagnosis Date  . Back pain   . Chronic neck pain   . Chronic back pain   . Migraine   . Carpal tunnel syndrome on both sides   . Esophageal spasm     uses aloe vera with success  . GERD (gastroesophageal reflux disease)     Zantac   Past Surgical History  Procedure Laterality Date  . Tonsillectomy    . Cholecystectomy    . Cesarean section  2007  . Dilation and curettage of uterus    . Cesarean section  01/27/2012    Procedure: CESAREAN SECTION;  Surgeon: Delbert Harness, MD;  Location: [facility];  Service: Gynecology;  Laterality: N/A;  . Nose surgery     No family history on file. Social History  Substance Use Topics  . Smoking status: Former Smoker -- 0.25 packs/day for 11 years    Types: Cigarettes  . Smokeless tobacco: None  . Alcohol Use: No   OB History    Gravida Para Term Preterm AB TAB SAB Ectopic Multiple Living   Review of Systems  Constitutional: Positive for fever. Negative for chills.  HENT: Negative for rhinorrhea and sore throat.   Eyes: Negative for visual disturbance.  Respiratory: Negative for chest tightness and shortness of breath.    Cardiovascular: Negative for chest pain and palpitations.  Gastrointestinal: Positive for nausea and abdominal pain. Negative for vomiting and constipation.  Genitourinary: Negative for dysuria, hematuria, vaginal bleeding and vaginal discharge.  Musculoskeletal: Negative for back pain and neck pain.  Skin: Negative for rash.  Neurological: Negative for dizziness and headaches.  Psychiatric/Behavioral: Negative for confusion.  All other systems reviewed and are negative.     Allergies  Latex; Nsaids; and Codeine  Home Medications   Prior to Admission medications   Medication Sig Start Date End Date Taking? Authorizing Provider  ALOE VERA JUICE PO Take 1 application by mouth as needed.    Historical Provider, MD  BuPROPion HCl (WELLBUTRIN PO) Take by mouth.    Historical Provider, MD  cephALEXin (KEFLEX) 500 MG capsule Take 1 capsule (500 mg total) by mouth 3 (three) times daily. 05/19/15   Maris Berger, MD  Esomeprazole Magnesium (NEXIUM PO) Take by mouth.    Historical Provider, MD  Lisdexamfetamine Dimesylate (VYVANSE PO) Take by mouth.    Historical Provider, MD  Lurasidone HCl (LATUDA) 20 MG TABS Take 20 mg by mouth daily.    Historical Provider, MD  metoCLOPramide (REGLAN) 10 MG tablet Take 1 tablet (10 mg total) by mouth every 6 (six) hours as  needed (nausea or headache). 07/02/12   Dione Booze, MD  Nutritional Supplements (COMPLETE NUTRITION PLUS PO) Take 1 application by mouth as needed.    Historical Provider, MD  omeprazole-sodium bicarbonate (ZEGERID) 40-1100 MG per capsule Take 1 capsule by mouth as needed.    Historical Provider, MD  ondansetron (ZOFRAN ODT) 4 MG disintegrating tablet Take 1 tablet (4 mg total) by mouth every 8 (eight) hours as needed for nausea or vomiting. 05/19/15   Maris Berger, MD  oxyCODONE-acetaminophen (PERCOCET/ROXICET) 5-325 MG per tablet Take 1 tablet by mouth every 4 (four) hours as needed.    Historical Provider, MD  Ranitidine HCl (ZANTAC  PO) Take by mouth.    Historical Provider, MD  Ranitidine HCl (ZANTAC PO) Take by mouth.    Historical Provider, MD  traMADol (ULTRAM) 50 MG tablet Take 1 tablet (50 mg total) by mouth every 6 (six) hours as needed for moderate pain or severe pain. 05/19/15   Maris Berger, MD   BP 121/74 mmHg  Pulse 93  Temp(Src) 97.8 F (36.6 C) (Oral)  Resp 18  Ht  (1.626 m)  Wt 206 lb (93.441 kg)  BMI 35.34 kg/m2  SpO2 99% Physical Exam  Constitutional: She is oriented to person, place, and time. She appears well-developed and well-nourished. She appears distressed.  tearful  HENT:  Head: Normocephalic and atraumatic.  Mouth/Throat: Oropharynx is clear and moist.  Eyes: EOM are normal. Pupils are equal, round, and reactive to light.  Neck: Neck supple. No JVD present.  Cardiovascular: Normal rate, regular rhythm, normal heart sounds and intact distal pulses.  Exam reveals no gallop.   No murmur heard. Pulmonary/Chest: Effort normal and breath sounds normal. She has no wheezes. She has no rales.  Abdominal: Soft. She exhibits no distension. There is tenderness in the suprapubic area. There is CVA tenderness (bilateral, R > L).  Musculoskeletal: Normal range of motion. She exhibits no tenderness.  Neurological: She is alert and oriented to person, place, and time. No cranial nerve deficit. She exhibits normal muscle tone.  Skin: Skin is warm and dry. No rash noted.  Psychiatric: Her behavior is normal.    ED Course  Procedures  None   Labs Review Labs Reviewed  COMPREHENSIVE METABOLIC PANEL - Abnormal; Notable for the following:    Albumin 3.3 (*)    AST 59 (*)    ALT 79 (*)    Alkaline Phosphatase 146 (*)    All other components within normal limits  CBC - Abnormal; Notable for the following:    WBC 10.6 (*)    All other components within normal limits  URINALYSIS, ROUTINE W REFLEX MICROSCOPIC (NOT AT [facility]) - Abnormal; Notable for the following:    APPearance CLOUDY (*)    Hgb  urine dipstick SMALL (*)    Protein, ur 30 (*)    Leukocytes, UA SMALL (*)    All other components within normal limits  URINE MICROSCOPIC-ADD ON - Abnormal; Notable for the following:    Squamous Epithelial / LPF 0-5 (*)    Bacteria, UA RARE (*)    All other components within normal limits  LIPASE, BLOOD  HEPATITIS PANEL, ACUTE  POC URINE PREG, ED    Imaging Review Ct Renal Stone Study  05/19/2015  CLINICAL DATA:  Bilateral flank pain, generalized abdominal pain, nausea, burning with urination, and urinary frequency since Tuesday. Diagnosed with kidney infection at the [facility] urgent care center yesterday. EXAM: CT ABDOMEN AND PELVIS WITHOUT CONTRAST TECHNIQUE:  Multidetector CT imaging of the abdomen and pelvis was performed following the standard protocol without IV contrast. COMPARISON:  Ultrasound upper abdomen 05/19/2015 FINDINGS: Mild dependent atelectasis in the lung bases. No renal, ureteral, or bladder stones. No hydronephrosis or hydroureter. There is mild hazy stranding around the right kidney and there appears to be some thickening and stranding involving the right ureter. This likely represents pyelonephritis. Unenhanced appearance of the left kidney is unremarkable. Bladder wall is not thickened. Surgical absence of the gallbladder. No bile duct dilatation. The unenhanced appearance of the liver, spleen, pancreas, adrenal glands, abdominal aorta, inferior vena cava, and retroperitoneal lymph nodes are unremarkable. Stomach, small bowel, and colon are not abnormally distended. Stool fills the colon. No free air or free fluid in the abdomen. Abdominal wall musculature appears intact. Pelvis: Appendix is normal. Uterus and ovaries are not enlarged. Minimal free fluid in the pelvis is likely physiologic. No pelvic mass or lymphadenopathy. No destructive bone lesions. IMPRESSION: Mild stranding around the right kidney in ureter with suggestion of thickening of the right ureter wall.  Changes likely to be due to pyelonephritis. No renal or ureteral stones identified. No hydronephrosis or hydroureter. Electronically Signed   By: Burman NievesWilliam  Stevens M.D.   On: 05/19/2015 19:51   [place] Abdomen Limited Ruq  05/19/2015  CLINICAL DATA:  Increased LFTs.  Right upper quadrant pain. EXAM: US ABDOMEN LIMITED - RIGHT UPPER QUADRANT COMPARISON:  None. FINDINGS: Gallbladder: Surgically absent Common bile duct: Diameter: 4.1 mm, within normal limits Liver: No focal lesion identified. Within normal limits in parenchymal echogenicity. IMPRESSION: 1. Cholecystectomy. 2. Normal sonographic appearance of the liver. Electronically Signed   By: Marin Robertshristopher  Mattern M.D.   On: 05/19/2015 19:08   I have personally reviewed and evaluated these images and lab results as part of my medical decision-making.  MDM   Final diagnoses:  Elevated LFTs  Abdominal pain  Acute flank pain  Pyelonephritis   Patient is a 34 year old female with history of migraines and GERD who presents with dysuria, bilateral flank pain and nausea. Symptom onset was approximately 6 days ago. Patient states that she does not have insurance and so delayed seeking medical care. She went to urgent care yesterday and they told her she had a UTI. They prescribe Cipro. She states that she took 2 tablets yesterday 1 tablet today but does not feel that the symptoms are improving. She states that her back pain is getting worse. Patient is tearful and has a tachycardic palpable pulse. She has bilateral flank tenderness to palpation.   UA with rare bacteria, small blood, small leuks. Rocephin given. RUQ US with normal appearance of liver. Will get CT to look for stone.  No kidney stone but there is evidence of right pyelonephritis. On reassessment, patient reports improvement in her pain to 2/10 from 8/10. Vitals remain stable. Will d/c home with zofran, keflex (instructed to d/c cipro given patient's poor tolerance) and ultram for pain. Patient  agreeable with plan.  Discussed with Dr. Jodi MourningZavitz.   Maris BergerJonah Yumi Insalaco, MD 05/19/15 2137  Blane OharaJoshua Zavitz, MD 05/19/15 2151

## 2015-05-19 NOTE — ED Notes (Signed)
C/o bilateral flank pain, generalized abd pain, nausea, burning with urination, and urinary frequency since Tuesday.  [facility] and diagnosed with kidney infection.  Pain worse since starting antibiotic.

## 2015-05-19 NOTE — ED Notes (Signed)
Water given to patient 

## 2015-08-05 ENCOUNTER — Encounter (HOSPITAL_BASED_OUTPATIENT_CLINIC_OR_DEPARTMENT_OTHER): Payer: Self-pay

## 2015-08-05 ENCOUNTER — Emergency Department (HOSPITAL_BASED_OUTPATIENT_CLINIC_OR_DEPARTMENT_OTHER)
Admission: EM | Admit: 2015-08-05 | Discharge: 2015-08-05 | Disposition: A | Discharge status: Home / Self Care | Payer: Medicaid Other | Attending: Emergency Medicine | Admitting: Emergency Medicine

## 2015-08-05 ENCOUNTER — Emergency Department (HOSPITAL_BASED_OUTPATIENT_CLINIC_OR_DEPARTMENT_OTHER): Payer: Medicaid Other

## 2015-08-05 DIAGNOSIS — Y92008 Other place in unspecified non-institutional (private) residence as the place of occurrence of the external cause: Secondary | ICD-10-CM | POA: Insufficient documentation

## 2015-08-05 DIAGNOSIS — Z8679 Personal history of other diseases of the circulatory system: Secondary | ICD-10-CM | POA: Diagnosis not present

## 2015-08-05 DIAGNOSIS — G8929 Other chronic pain: Secondary | ICD-10-CM | POA: Insufficient documentation

## 2015-08-05 DIAGNOSIS — S8991XA Unspecified injury of right lower leg, initial encounter: Secondary | ICD-10-CM | POA: Diagnosis present

## 2015-08-05 DIAGNOSIS — Y998 Other external cause status: Secondary | ICD-10-CM | POA: Insufficient documentation

## 2015-08-05 DIAGNOSIS — Y9389 Activity, other specified: Secondary | ICD-10-CM | POA: Insufficient documentation

## 2015-08-05 DIAGNOSIS — Z87891 Personal history of nicotine dependence: Secondary | ICD-10-CM | POA: Insufficient documentation

## 2015-08-05 DIAGNOSIS — K219 Gastro-esophageal reflux disease without esophagitis: Secondary | ICD-10-CM | POA: Diagnosis not present

## 2015-08-05 DIAGNOSIS — Z79899 Other long term (current) drug therapy: Secondary | ICD-10-CM | POA: Diagnosis not present

## 2015-08-05 DIAGNOSIS — Z91040 Latex allergy status: Secondary | ICD-10-CM | POA: Insufficient documentation

## 2015-08-05 DIAGNOSIS — X58XXXA Exposure to other specified factors, initial encounter: Secondary | ICD-10-CM | POA: Insufficient documentation

## 2015-08-05 MED ORDER — TRAMADOL HCL 50 MG PO TABS: 50.0000 mg | ORAL_TABLET | Freq: Four times a day (QID) | ORAL | Status: DC | PRN

## 2015-08-05 MED ORDER — PREDNISONE 50 MG PO TABS: 50.0000 mg | ORAL_TABLET | Freq: Every day | ORAL | Status: DC

## 2015-08-05 MED FILL — predniSONE 50 MG TABS: 50 | 5 days supply | Qty: 5 | Fill #0

## 2015-08-05 MED FILL — traMADol HCL 50 MG TABS: 50 | 3 days supply | Qty: 15 | Fill #0

## 2015-08-05 NOTE — ED Notes (Signed)
Right knee injury 3 weeks ago-no medical care-limping gait to triage-NAD

## 2015-08-05 NOTE — ED Provider Notes (Signed)
CSN: [id number]     Arrival date & time 08/05/15  1315 History   First MD Initiated Contact with Patient 08/05/15 1444     Chief Complaint  Patient presents with  . Knee Injury     (Consider location/radiation/quality/duration/timing/severity/associated sxs/prior Treatment) HPI Patient presents to the emergency department with right knee pain that started after a knee injury 3 weeks ago.patient states that she had a fall right for Christmas and landed on her knee.  The reentry was 3 weeks ago she was doing some organization of some items while kneeling on her living room floor.  The patient states since that time.  She has had increasing pain despite ice and elevation.  He should states that she did not take any other medications prior to arrival.  Nothing seems to make the condition better, movement and palpation make the pain worse.  Patient denies weakness, numbness, dizziness, back pain, dysuria, incontinence, abdominal pain, near syncope or syncope   Past Medical History  Diagnosis Date  . Back pain   . Chronic neck pain   . Chronic back pain   . Migraine   . Carpal tunnel syndrome on both sides   . Esophageal spasm     uses aloe vera with success  . GERD (gastroesophageal reflux disease)     Zantac   Past Surgical History  Procedure Laterality Date  . Tonsillectomy    . Cholecystectomy    . Cesarean section  2007  . Dilation and curettage of uterus    . Cesarean section  01/27/2012    Procedure: CESAREAN SECTION;  Surgeon: Delbert Harness, MD;  Location: [facility];  Service: Gynecology;  Laterality: N/A;  . Nose surgery     No family history on file. Social History  Substance Use Topics  . Smoking status: Former Smoker -- 0.25 packs/day for 11 years    Types: Cigarettes  . Smokeless tobacco: None  . Alcohol Use: No   OB History    Gravida Para Term Preterm AB TAB SAB Ectopic Multiple Living   Review of Systems All other systems negative except as  documented in the HPI. All pertinent positives and negatives as reviewed in the HPI.   Allergies  Latex; Nsaids; and Codeine  Home Medications   Prior to Admission medications   Medication Sig Start Date End Date Taking? Authorizing Provider  ALOE VERA JUICE PO Take 1 application by mouth as needed.    Historical Provider, MD  Esomeprazole Magnesium (NEXIUM PO) Take by mouth.    Historical Provider, MD  Nutritional Supplements (COMPLETE NUTRITION PLUS PO) Take 1 application by mouth as needed.    Historical Provider, MD  omeprazole-sodium bicarbonate (ZEGERID) 40-1100 MG per capsule Take 1 capsule by mouth as needed.    Historical Provider, MD  Ranitidine HCl (ZANTAC PO) Take by mouth.    Historical Provider, MD  Ranitidine HCl (ZANTAC PO) Take by mouth.    Historical Provider, MD   BP 103/72 mmHg  Pulse 74  Temp(Src) 98.1 F (36.7 C) (Oral)  Resp 18  Ht  (1.626 m)  Wt 87.091 kg  BMI 32.94 kg/m2  SpO2 100% Physical Exam  Constitutional: She is oriented to person, place, and time. She appears well-developed and well-nourished. No distress.  HENT:  Head: Normocephalic and atraumatic.  Eyes: Pupils are equal, round, and reactive to light.  Neck: Normal range of motion. Neck supple.  Pulmonary/Chest: Effort normal.  Musculoskeletal:       Right knee: She exhibits normal range of motion, no swelling, no effusion, no ecchymosis, no deformity, no erythema, normal alignment, no LCL laxity, normal patellar mobility, no bony tenderness, normal meniscus and no MCL laxity. Tenderness found. Medial joint line tenderness noted.  Neurological: She is alert and oriented to person, place, and time. She exhibits normal muscle tone. Coordination normal.  Psychiatric: She has a normal mood and affect. Her behavior is normal.  Nursing note and vitals reviewed.   ED Course  Procedures (including critical care time) Labs Review Labs Reviewed - No data to display  Imaging Review Dg Knee  Complete 4 Views Right  08/05/2015  CLINICAL DATA:  Pain after crawling on knees. Trauma 2 months prior. EXAM: RIGHT KNEE - COMPLETE 4+ VIEW COMPARISON:  None. FINDINGS: Frontal, lateral, and bilateral oblique views were obtained. There is no demonstrable fracture or dislocation. No joint effusion. The joint spaces appear normal. No erosive change. IMPRESSION: No fracture or joint effusion.  No appreciable arthropathy. Electronically Signed   By: Bretta Bang III M.D.   On: 08/05/2015 13:59   I have personally reviewed and evaluated these images and lab results as part of my medical decision-making.   EKG Interpretation None       the patient will be placed in a knee immobilizer and referred to orthopedics.  Told to ice and elevate her knee.  Told to return here as needed.  Patient agrees the plan and all questions were answered   Charlestine Night, PA-C 08/05/15 1523  Tilden Fossa, MD 08/06/15 [phone]

## 2015-08-05 NOTE — Discharge Instructions (Signed)
Return here as needed.  Ice and elevate your knee.  Follow-up with the orthopedist provided. 

## 2015-12-26 ENCOUNTER — Encounter (HOSPITAL_BASED_OUTPATIENT_CLINIC_OR_DEPARTMENT_OTHER): Payer: Self-pay | Admitting: Emergency Medicine

## 2015-12-26 ENCOUNTER — Emergency Department (HOSPITAL_BASED_OUTPATIENT_CLINIC_OR_DEPARTMENT_OTHER)
Admission: EM | Admit: 2015-12-26 | Discharge: 2015-12-26 | Disposition: A | Discharge status: Home / Self Care | Payer: Medicaid Other | Attending: Emergency Medicine | Admitting: Emergency Medicine

## 2015-12-26 DIAGNOSIS — T63441A Toxic effect of venom of bees, accidental (unintentional), initial encounter: Secondary | ICD-10-CM | POA: Diagnosis not present

## 2015-12-26 DIAGNOSIS — Z87891 Personal history of nicotine dependence: Secondary | ICD-10-CM | POA: Diagnosis not present

## 2015-12-26 DIAGNOSIS — R202 Paresthesia of skin: Secondary | ICD-10-CM | POA: Diagnosis present

## 2015-12-26 MED ORDER — EPINEPHRINE 0.3 MG/0.3ML IJ SOAJ: 0.3000 mg | Freq: Once | INTRAMUSCULAR | Status: DC

## 2015-12-26 MED ORDER — PREDNISONE 50 MG PO TABS
60.0000 mg | ORAL_TABLET | Freq: Once | ORAL | Status: AC
  Administered 2015-12-26: 60 mg via ORAL
  Filled 2015-12-26: qty 1

## 2015-12-26 MED ORDER — TETANUS-DIPHTH-ACELL PERTUSSIS 5-2.5-18.5 LF-MCG/0.5 IM SUSP
0.5000 mL | Freq: Once | INTRAMUSCULAR | Status: AC
  Administered 2015-12-26: 0.5 mL via INTRAMUSCULAR
  Filled 2015-12-26: qty 0.5

## 2015-12-26 MED ORDER — PREDNISONE 20 MG PO TABS: ORAL_TABLET | ORAL | Status: DC

## 2015-12-26 MED ORDER — DIPHENHYDRAMINE HCL 25 MG PO CAPS
50.0000 mg | ORAL_CAPSULE | Freq: Once | ORAL | Status: AC
  Administered 2015-12-26: 50 mg via ORAL
  Filled 2015-12-26: qty 2

## 2015-12-26 MED FILL — predniSONE 20 MG TABS: 20 | 4 days supply | Qty: 8 | Fill #0

## 2015-12-26 MED FILL — EPINEPHRINE 0.3 MG AUTO-INJ: 0.3 | 2 days supply | Qty: 2 | Fill #0

## 2015-12-26 NOTE — ED Notes (Signed)
Stung x3 yesterday by yellow jackets>>rt leg   Today started having tingling of face and around mouth

## 2015-12-26 NOTE — ED Notes (Signed)
Pt assisted to bathroom via wheel chair. Pt c/o dizziness. Awaiting MD evaluation.

## 2015-12-26 NOTE — ED Provider Notes (Signed)
CSN: [id number]     Arrival date & time 12/26/15  1132 History   First MD Initiated Contact with Patient 12/26/15 1240     Chief Complaint  Patient presents with  . Insect Bite     (Consider location/radiation/quality/duration/timing/severity/associated sxs/prior Treatment) HPI Patient was stung by 3 yellow jackets on her right leg yesterday. Today he awakened with tingling around her mouth feeling of her throat closing and mild dyspnea as well as mild lightheadedness. No other associated symptoms. Treated with Benadryl last dose yesterday. Nothing makes symptoms better or worse. No other associated symptoms. Past Medical History  Diagnosis Date  . Back pain   . Chronic neck pain   . Chronic back pain   . Migraine   . Carpal tunnel syndrome on both sides   . Esophageal spasm     uses aloe vera with success  . GERD (gastroesophageal reflux disease)     Zantac   Past Surgical History  Procedure Laterality Date  . Tonsillectomy    . Cholecystectomy    . Cesarean section  2007  . Dilation and curettage of uterus    . Cesarean section  01/27/2012    Procedure: CESAREAN SECTION;  Surgeon: Delbert Harnessaniel H Moore, MD;  Location: [facility];  Service: Gynecology;  Laterality: N/A;  . Nose surgery     No family history on file. Social History  Substance Use Topics  . Smoking status: Former Smoker -- 0.25 packs/day for 11 years    Types: Cigarettes, E-cigarettes  . Smokeless tobacco: None  . Alcohol Use: No   OB History    Gravida Para Term Preterm AB TAB SAB Ectopic Multiple Living   3 2 2  1  1   2      Review of Systems  Respiratory: Positive for shortness of breath.   Skin: Positive for wound.       Redness of right leg at site of bee sting  Neurological: Positive for light-headedness.  All other systems reviewed and are negative.     Allergies  Latex; Nsaids; and Codeine  Home Medications   Prior to Admission medications   Medication Sig Start Date End Date Taking?  Authorizing Provider  ALOE VERA JUICE PO Take 1 application by mouth as needed.    Historical Provider, MD  Esomeprazole Magnesium (NEXIUM PO) Take by mouth.    Historical Provider, MD  Nutritional Supplements (COMPLETE NUTRITION PLUS PO) Take 1 application by mouth as needed.    Historical Provider, MD  omeprazole-sodium bicarbonate (ZEGERID) 40-1100 MG per capsule Take 1 capsule by mouth as needed.    Historical Provider, MD  predniSONE (DELTASONE) 50 MG tablet Take 1 tablet (50 mg total) by mouth daily with breakfast. 08/05/15   Charlestine Nighthristopher Lawyer, PA-C  Ranitidine HCl (ZANTAC PO) Take by mouth.    Historical Provider, MD  Ranitidine HCl (ZANTAC PO) Take by mouth.    Historical Provider, MD  traMADol (ULTRAM) 50 MG tablet Take 1 tablet (50 mg total) by mouth every 6 (six) hours as needed. 08/05/15   Christopher Lawyer, PA-C   BP 125/78 mmHg  Pulse 74  Temp(Src) 97.8 F (36.6 C) (Oral)  Resp 20  Ht 5\' 4"  (1.626 m)  Wt 206 lb (93.441 kg)  BMI 35.34 kg/m2  SpO2 100% Physical Exam  Constitutional: She is oriented to person, place, and time. She appears well-developed and well-nourished. No distress.  HENT:  Head: Normocephalic and atraumatic.  Eyes: Conjunctivae are normal. Pupils are equal, round, and  reactive to light.  Neck: Neck supple. No tracheal deviation present. No thyromegaly present.  Cardiovascular: Normal rate and regular rhythm.   No murmur heard. Pulmonary/Chest: Effort normal and breath sounds normal.  Abdominal: Soft. Bowel sounds are normal. She exhibits no distension. There is no tenderness.  Musculoskeletal: Normal range of motion. She exhibits no edema or tenderness.  Neurological: She is alert and oriented to person, place, and time. Coordination normal.  Skin: Skin is warm and dry. No rash noted.  No stinger present right lower extremity minimally reddened over distal thigh and proximal calf, consistent with localized allergic reaction.  reddened area is not warm to  touch  Psychiatric: She has a normal mood and affect.  Nursing note and vitals reviewed.   ED Course  Procedures (including critical care time) Labs Review Labs Reviewed - No data to display  Imaging Review No results found. I have personally reviewed and evaluated these images and lab results as part of my medical decision-making.   EKG Interpretation None    3:20 PM patient feels improved and ready to go home. [facility] without difficulty and without lightheadedness.  MDM   prescription prednisone , EpiPen, Benadryl 50 mg 4 times daily Final diagnoses:  None  Diagnosis allergic reaction to bee sting      Doug SouSam Daine Gunther, MD 12/26/15 1529

## 2015-12-26 NOTE — Discharge Instructions (Signed)
Bee, Wasp, or Hornet Sting If you feel worse or get stung by a bee, use your EpiPen and get to the hospital. Take Benadryl 50 mg 4 times daily until your symptoms resolve. Benadryl can cause drowsiness. Return if you feel worse for any reason. Bees, wasps, and hornets are part of a family of insects that can sting people. These stings can cause pain and inflammation, but they are usually not serious. However, some people may have an allergic reaction to a sting. This can cause the symptoms to be more severe.  SYMPTOMS  Common symptoms of this condition include:   A red lump in the skin that sometimes has a tiny hole in the center. In some cases, a stinger may be in the center of the wound.  Pain and itching at the sting site.  Redness and swelling around the sting site. If you have an allergic reaction (localized allergic reaction), the swelling and redness may spread out from the sting site. In some cases, this reaction can continue to develop over the next 12-36 hours. In rare cases, a person may have a severe allergic reaction (anaphylactic reaction) to a sting. Symptoms of an anaphylactic reaction may include:   Wheezing or difficulty breathing.  Raised, itchy, red patches on the skin.  Nausea or vomiting.  Abdominal cramping.  Diarrhea.  Chest pain.  Fainting.  Redness of the face (flushing). DIAGNOSIS  This condition is usually diagnosed based on symptoms, medical history, and a physical exam. TREATMENT  Most stings can be treated with:   Icing to reduce swelling.  Medicines (antihistamines) to treat itching or an allergic reaction.  Medicines to help reduce pain. These may be medicines that you take by mouth, or medicated creams or lotions that you apply to your skin. If you were stung by a bee, the stinger and a small sac of poison may be in the wound. This may be removed by brushing across it with a flat card, such as a credit card. Another method is to pinch the area  and pull it out. These methods can help reduce the severity of the body's reaction to the sting.  HOME CARE INSTRUCTIONS   Wash the sting site daily with soap and water as told by your health care provider.  Apply or take over-the-counter and prescription medicines only as told by your health care provider.  If directed, apply ice to the sting area.  Put ice in a plastic bag.  Place a towel between your skin and the bag.  Leave the ice on for 20 minutes, 2-3 times per day.  Do not scratch the sting area.  To lessen pain, try using a paste that is made of water and baking soda. Rub the paste on the sting area and leave it on for 5 minutes.  If you had a severe allergic reaction to a sting, you may need:  To wear a medical bracelet or necklace that lists the allergy.  To learn when and how to use an anaphylaxis kit or epinephrine injection. Your family members may also need to learn this.  To carry an anaphylaxis kit with you at all times. SEEK MEDICAL CARE IF:   Your symptoms do not get better in 2-3 days.  You have redness, swelling, or pain that spreads beyond the area of the sting.  You have a fever. SEEK IMMEDIATE MEDICAL CARE IF:  You have symptoms of a severe allergic reaction. These include:   Wheezing or difficulty breathing.  Chest pain.  Light-headedness or fainting.  Itchy, raised, red patches on the skin.  Nausea or vomiting.  Abdominal cramping.  Diarrhea.   This information is not intended to replace advice given to you by your health care provider. Make sure you discuss any questions you have with your health care provider.   Document Released: 06/15/2005 Document Revised: 03/06/2015 Document Reviewed: 10/31/2014 Elsevier Interactive Patient Education [employer].

## 2016-07-03 ENCOUNTER — Ambulatory Visit: Payer: Medicaid Other | Admitting: Internal Medicine

## 2016-07-14 ENCOUNTER — Encounter: Payer: Self-pay | Admitting: *Deleted

## 2016-08-21 ENCOUNTER — Ambulatory Visit (INDEPENDENT_AMBULATORY_CARE_PROVIDER_SITE_OTHER): Payer: Medicaid Other | Admitting: Internal Medicine

## 2016-08-21 ENCOUNTER — Other Ambulatory Visit (INDEPENDENT_AMBULATORY_CARE_PROVIDER_SITE_OTHER): Payer: Medicaid Other

## 2016-08-21 ENCOUNTER — Encounter (INDEPENDENT_AMBULATORY_CARE_PROVIDER_SITE_OTHER): Payer: Self-pay

## 2016-08-21 ENCOUNTER — Other Ambulatory Visit: Payer: Self-pay | Admitting: Internal Medicine

## 2016-08-21 ENCOUNTER — Encounter: Payer: Self-pay | Admitting: Internal Medicine

## 2016-08-21 VITALS — BP 112/62 | HR 72 | Ht 64.0 in | Wt 190.2 lb

## 2016-08-21 DIAGNOSIS — Z86010 Personal history of colonic polyps: Secondary | ICD-10-CM

## 2016-08-21 DIAGNOSIS — R945 Abnormal results of liver function studies: Secondary | ICD-10-CM

## 2016-08-21 DIAGNOSIS — R7989 Other specified abnormal findings of blood chemistry: Secondary | ICD-10-CM

## 2016-08-21 DIAGNOSIS — R1012 Left upper quadrant pain: Secondary | ICD-10-CM

## 2016-08-21 LAB — COMPREHENSIVE METABOLIC PANEL
ALK PHOS: 99 U/L (ref 39–117)
ALT: 24 U/L (ref 0–35)
AST: 25 U/L (ref 0–37)
Albumin: 4.7 g/dL (ref 3.5–5.2)
BILIRUBIN TOTAL: 0.6 mg/dL (ref 0.2–1.2)
BUN: 8 mg/dL (ref 6–23)
CALCIUM: 9.5 mg/dL (ref 8.4–10.5)
CHLORIDE: 105 meq/L (ref 96–112)
CO2: 26 meq/L (ref 19–32)
CREATININE: 0.93 mg/dL (ref 0.40–1.20)
GFR: 73.03 mL/min (ref >60.00)
GLUCOSE: 98 mg/dL (ref 70–99)
POTASSIUM: 4 meq/L (ref 3.5–5.1)
Sodium: 139 mEq/L (ref 135–145)
Total Protein: 7.6 g/dL (ref 6.0–8.3)

## 2016-08-21 LAB — IGA: IGA: 276 mg/dL (ref 68–378)

## 2016-08-21 LAB — CBC WITH DIFFERENTIAL/PLATELET
BASOS PCT: 1.4 % (ref 0.0–3.0)
Basophils Absolute: 0.1 10*3/uL (ref 0.0–0.1)
EOS ABS: 0.2 10*3/uL (ref 0.0–0.7)
Eosinophils Relative: 3.7 % (ref 0.0–5.0)
HEMATOCRIT: 45.7 % (ref 36.0–46.0)
Hemoglobin: 15.8 g/dL — ABNORMAL HIGH (ref 12.0–15.0)
LYMPHS PCT: 37.2 % (ref 12.0–46.0)
Lymphs Abs: 2.3 10*3/uL (ref 0.7–4.0)
MCHC: 34.6 g/dL (ref 30.0–36.0)
MCV: 88.9 fl (ref 78.0–100.0)
Monocytes Absolute: 0.5 10*3/uL (ref 0.1–1.0)
Monocytes Relative: 7.4 % (ref 3.0–12.0)
NEUTROS ABS: 3.1 10*3/uL (ref 1.4–7.7)
Neutrophils Relative %: 50.3 % (ref 43.0–77.0)
PLATELETS: 390 10*3/uL (ref 150.0–400.0)
RBC: 5.14 Mil/uL — ABNORMAL HIGH (ref 3.87–5.11)
RDW: 13.9 % (ref 11.5–15.5)
WBC: 6.1 10*3/uL (ref 4.0–10.5)

## 2016-08-21 MED ORDER — NA SULFATE-K SULFATE-MG SULF 17.5-3.13-1.6 GM/177ML PO SOLN: ORAL | 0 refills | Status: DC

## 2016-08-21 MED ORDER — ESOMEPRAZOLE MAGNESIUM 40 MG PO CPDR: 40.0000 mg | DELAYED_RELEASE_CAPSULE | Freq: Two times a day (BID) | ORAL | 3 refills | Status: DC

## 2016-08-21 MED ORDER — SUCRALFATE 1 G PO TABS: 1.0000 g | ORAL_TABLET | Freq: Three times a day (TID) | ORAL | 3 refills | Status: DC

## 2016-08-21 NOTE — Progress Notes (Signed)
Patient ID: Gabriela PickupKimberly Benitez, female   DOB: 01-May-1981, [age] y.o.   MRN: [id number] HPI: Gabriela Benitez is a 36 year old female with a past medical history of GERD, peptic ulcers, esophageal spasm, chronic back pain, anxiety and depression who seen in consultation at the request of Zoe LanPenny Jones, NP to evaluate left upper quadrant abdominal pain. She is here alone today.  She reports that over the last month she has developed increasingly severe left upper quadrant pain. This is worse with eating. Worse with fried foods, greasy foods and processed foods. Now even a bland diet bothers her. Pain occasionally radiates slightly downward to the left midabdomen but not to her back. Pain is sharp in nature. She's lost about 5 pounds in the last month. Pain is not associated with nausea or vomiting. She has been taking ranitidine 150 mg once to twice daily for years for reflux type symptoms. She reports previously having taken Nexium and reports a history of peptic ulcer disease diagnosed by endoscopy years ago. She resumed over-the-counter Nexium about 2 days ago. She does use Aleve about 4 pills per day for headaches and chronic back pain. She reports recently that bowel movements have been more irregular in that she's been having loose stools which are urgent. This seems to come and go. She felt like symptoms had improved though this morning had loose stools. She admits to not looking at stool and has not seen blood or black tarry stool on the toilet tissue with wiping. She reports she drinks alcohol on a daily basis usually amaretto mixed with soda. She has avoided this for the last month due to the left upper quadrant pain.  She reports in 2009 she had a GI evaluation including colonoscopy. At that time she was having similar left-sided abdominal pain which ended up being gallbladder related. She had gallstones and underwent cholecystectomy with resolution of this pain. The colonoscopy revealed incidental colon  polyps that she is unsure of what type. She cannot remember if recall is recommended in 5 or 10 years. Her grandmother had colon cancer. Her father had stomach cancer and is on Gleevec.  Review of her records revealed elevated AST, ALT and alkaline phosphatase in 2016. She feels that this was related to an infectious process specifically pyelonephritis. She feels that she has had more recent liver enzymes which were unremarkable. She denies a family history of liver disease.  Past Medical History:  Diagnosis Date  . Back pain   . Carpal tunnel syndrome on both sides   . Chronic back pain   . Chronic neck pain   . Elevated LFTs    acute pyleonephritis  . Esophageal spasm    uses aloe vera with success  . GERD (gastroesophageal reflux disease)    Zantac  . Migraine     Past Surgical History:  Procedure Laterality Date  . CESAREAN SECTION  2007  . CESAREAN SECTION  01/27/2012   Procedure: CESAREAN SECTION;  Surgeon: Delbert Harnessaniel H Moore, MD;  Location: [facility];  Service: Gynecology;  Laterality: N/A;  . CHOLECYSTECTOMY    . DILATION AND CURETTAGE OF UTERUS    . NOSE SURGERY    . TONSILLECTOMY      Outpatient Medications Prior to Visit  Medication Sig Dispense Refill  . EPINEPHrine 0.3 mg/0.3 mL IJ SOAJ injection Inject 0.3 mLs (0.3 mg total) into the muscle once. 1 Device 1  . ALOE VERA JUICE PO Take 1 application by mouth as needed.    . Esomeprazole Magnesium (  NEXIUM PO) Take by mouth.    . Nutritional Supplements (COMPLETE NUTRITION PLUS PO) Take 1 application by mouth as needed.    Marland Kitchen omeprazole-sodium bicarbonate (ZEGERID) 40-1100 MG per capsule Take 1 capsule by mouth as needed.    . predniSONE (DELTASONE) 20 MG tablet 2 tabs po daily x 4 days starting 12/27/15 8 tablet 0  . Ranitidine HCl (ZANTAC PO) Take by mouth.    . Ranitidine HCl (ZANTAC PO) Take by mouth.    . traMADol (ULTRAM) 50 MG tablet Take 1 tablet (50 mg total) by mouth every 6 (six) hours as needed. 15 tablet 0   No  facility-administered medications prior to visit.     Allergies  Allergen Reactions  . Latex     Rash,  No severe allergy but gets rash for 24 hours, not breathing difficulties  . Miralax [Polyethylene Glycol] Itching  . Codeine Other (See Comments)    Migraines and esophageal spasm    History reviewed. No pertinent family history.  Social History  Substance Use Topics  . Smoking status: Former Smoker    Packs/day: 0.25    Years: 11.00    Types: Cigarettes, E-cigarettes  . Smokeless tobacco: Never Used  . Alcohol use No    ROS: As per history of present illness, otherwise negative  BP 112/62   Pulse 72   Ht 5\' 4"  (1.626 m)   Wt 190 lb 3.2 oz (86.3 kg)   BMI 32.65 kg/m  Constitutional: Well-developed and well-nourished. No distress. HEENT: Normocephalic and atraumatic. Oropharynx is clear and moist. No oropharyngeal exudate. Conjunctivae are normal.  No scleral icterus. Neck: Neck supple. Trachea midline. Cardiovascular: Normal rate, regular rhythm and intact distal pulses. No M/R/G Pulmonary/chest: Effort normal and breath sounds normal. No wheezing, rales or rhonchi. Abdominal: Soft, Tender in the left upper and mid quadrant without rebound or guarding, nondistended. Bowel sounds active throughout. There are no masses palpable. No hepatosplenomegaly. Extremities: no clubbing, cyanosis, or edema Lymphadenopathy: No cervical adenopathy noted. Neurological: Alert and oriented to person place and time. Skin: Skin is warm and dry. No rashes noted. Psychiatric: Normal mood and affect. Behavior is normal.  RELEVANT LABS AND IMAGING: CBC    Component Value Date/Time   WBC 10.6 (H) 05/19/2015 1621   RBC 4.36 05/19/2015 1621   HGB 13.1 05/19/2015 1621   HCT 39.5 05/19/2015 1621   PLT 318 05/19/2015 1621   MCV 90.6 05/19/2015 1621   MCH 30.0 05/19/2015 1621   MCHC 33.2 05/19/2015 1621   RDW 12.8 05/19/2015 1621   LYMPHSABS 0.8 03/13/2014 1255   MONOABS 0.5 03/13/2014  1255   EOSABS 0.1 03/13/2014 1255   BASOSABS 0.0 03/13/2014 1255    CMP     Component Value Date/Time   NA 137 05/19/2015 1621   K 4.0 05/19/2015 1621   CL 105 05/19/2015 1621   CO2 25 05/19/2015 1621   GLUCOSE 98 05/19/2015 1621   BUN 7 05/19/2015 1621   CREATININE 0.65 05/19/2015 1621   CALCIUM 9.2 05/19/2015 1621   PROT 7.1 05/19/2015 1621   ALBUMIN 3.3 (L) 05/19/2015 1621   AST 59 (H) 05/19/2015 1621   ALT 79 (H) 05/19/2015 1621   ALKPHOS 146 (H) 05/19/2015 1621   BILITOT 0.3 05/19/2015 1621   GFRNONAA >60 05/19/2015 1621   GFRAA >60 05/19/2015 1621    ASSESSMENT/PLAN: 36 year old female with a past medical history of GERD, peptic ulcers, esophageal spasm, chronic back pain, anxiety and depression who seen in consultation  at the request of Zoe Lan, NP to evaluate left upper quadrant abdominal pain.  1. Left upper quadrant pain -- suspicion for gastroduodenitis or even peptic ulcer disease in the setting of daily NSAID use. She has been off of PPI. Given history of elevated liver enzymes, gallstones and biliary pain causing left-sided abdominal pain I will repeat liver enzymes today. Also check CBC and celiac panel. I recommended that she try to eliminate naproxen or at the very least minimize it. Can use Tylenol up to 4 g daily for chronic pain. Begin Nexium 40 mg twice a day before meals. Begin Carafate 1 g 3 times a day before meals and at bedtime. Continue alcohol avoidance as this can worsen gastroduodenitis and ulcer disease. Upper endoscopy recommended to evaluate her pain and rule out peptic ulcer disease.  2. History of colon polyps -- we'll try to track down colonoscopy from 2009. If she had polyps she may be overdue for surveillance. We will schedule colonoscopy at the time of upper endoscopy for polyp surveillance. If records are reviewed and she did not have polyps or polyps were distal and hyperplastic she would not need this colonoscopy. If we cannot find records  then we will air on the side of surveillance. We discussed the risk, benefits and alternatives along with surveillance approach and she wishes to proceed.  3. History of elevated liver enzymes -- repeat liver enzymes today. See #1   RU:EAVWU Karsten Ro, Np [id number] [place], [place] [address]

## 2016-08-21 NOTE — Patient Instructions (Signed)
You have been scheduled for an endoscopy and colonoscopy. Please follow the written instructions given to you at your visit today. Please pick up your prep supplies at the pharmacy within the next 1-3 days. If you use inhalers (even only as needed), please bring them with you on the day of your procedure. Your physician has requested that you go to www.startemmi.com and enter the access code given to you at your visit today. This web site gives a general overview about your procedure. However, you should still follow specific instructions given to you by our office regarding your preparation for the procedure.  Your physician has requested that you go to the basement for the following lab work before leaving today: CBC, CMP, Celiac  We have sent the following medications to your pharmacy for you to pick up at your convenience: Nexium 40 mg twice daily before meals (increase from previous dosing) Cafrafate three times daily before meals and at bedtime  Decrease your Naproxen (Aleve) usage.  If you are age 35 or older, your body mass index should be between 23-30. Your Body mass index is 32.65 kg/m. If this is out of the aforementioned range listed, please consider follow up with your Primary Care Provider.  If you are age 35 or younger, your body mass index should be between 19-25. Your Body mass index is 32.65 kg/m. If this is out of the aformentioned range listed, please consider follow up with your Primary Care Provider.

## 2016-08-24 ENCOUNTER — Emergency Department (HOSPITAL_COMMUNITY)
Admission: EM | Admit: 2016-08-24 | Discharge: 2016-08-24 | Disposition: A | Discharge status: Home / Self Care | Payer: Medicaid Other | Attending: Emergency Medicine | Admitting: Emergency Medicine

## 2016-08-24 ENCOUNTER — Encounter (HOSPITAL_COMMUNITY): Payer: Self-pay | Admitting: Emergency Medicine

## 2016-08-24 ENCOUNTER — Telehealth: Payer: Self-pay | Admitting: Internal Medicine

## 2016-08-24 ENCOUNTER — Telehealth: Payer: Self-pay

## 2016-08-24 DIAGNOSIS — R1012 Left upper quadrant pain: Secondary | ICD-10-CM | POA: Diagnosis not present

## 2016-08-24 DIAGNOSIS — Z87891 Personal history of nicotine dependence: Secondary | ICD-10-CM | POA: Insufficient documentation

## 2016-08-24 DIAGNOSIS — Z79899 Other long term (current) drug therapy: Secondary | ICD-10-CM | POA: Insufficient documentation

## 2016-08-24 LAB — COMPREHENSIVE METABOLIC PANEL
ALBUMIN: 4 g/dL (ref 3.5–5.0)
ALK PHOS: 72 U/L (ref 38–126)
ALT: 18 U/L (ref 14–54)
ANION GAP: 8 (ref 5–15)
AST: 20 U/L (ref 15–41)
BILIRUBIN TOTAL: 0.9 mg/dL (ref 0.3–1.2)
BUN: 7 mg/dL (ref 6–20)
CALCIUM: 9.2 mg/dL (ref 8.9–10.3)
CO2: 23 mmol/L (ref 22–32)
Chloride: 107 mmol/L (ref 101–111)
Creatinine, Ser: 1.06 mg/dL — ABNORMAL HIGH (ref 0.44–1.00)
GFR calc Af Amer: >60 mL/min (ref >60)
GLUCOSE: 90 mg/dL (ref 65–99)
POTASSIUM: 3.5 mmol/L (ref 3.5–5.1)
Sodium: 138 mmol/L (ref 135–145)
Total Protein: 6.6 g/dL (ref 6.5–8.1)

## 2016-08-24 LAB — CBC
HEMATOCRIT: 40.9 % (ref 36.0–46.0)
Hemoglobin: 14 g/dL (ref 12.0–15.0)
MCH: 30.2 pg (ref 26.0–34.0)
MCHC: 34.2 g/dL (ref 30.0–36.0)
MCV: 88.3 fL (ref 78.0–100.0)
Platelets: 285 10*3/uL (ref 150–400)
RBC: 4.63 MIL/uL (ref 3.87–5.11)
RDW: 13.5 % (ref 11.5–15.5)
WBC: 5.3 10*3/uL (ref 4.0–10.5)

## 2016-08-24 LAB — TISSUE TRANSGLUTAMINASE, IGA: Tissue Transglutaminase Ab, IgA: 1 U/mL (ref <4)

## 2016-08-24 LAB — LIPASE, BLOOD: Lipase: 29 U/L (ref 11–51)

## 2016-08-24 LAB — POC OCCULT BLOOD, ED: FECAL OCCULT BLD: NEGATIVE

## 2016-08-24 MED ORDER — SUCRALFATE 1 GM/10ML PO SUSP
1.0000 g | Freq: Three times a day (TID) | ORAL | Status: DC
  Filled 2016-08-24 (×2): qty 10

## 2016-08-24 MED ORDER — GI COCKTAIL ~~LOC~~
30.0000 mL | Freq: Once | ORAL | Status: AC
  Administered 2016-08-24: 30 mL via ORAL
  Filled 2016-08-24: qty 30

## 2016-08-24 NOTE — ED Triage Notes (Signed)
Pt reports LUQ pain x1 month, states diagnosed with ulcer and put on nexium and carafate 2 days ago, pt reports pain has increased with meds and loss of appetite, denies vomiting. GI MD sent patient for CT scan and possible endoscopy. Has endoscopy scheduled a few weeks from now. Pt a/ox4, resp e/u.

## 2016-08-24 NOTE — Telephone Encounter (Signed)
-----   Message from Beverley FiedlerJay M Pyrtle, MD sent at 08/24/2016  8:48 AM EST ----- Regarding: ED visit Patient seen last week EGD colonoscopy scheduled later in March Called me twice overnight and I sent her to the ER Benign in the ER but pain worsening EGD now more important and colonoscopy. Please change EGD and schedule on March 8 at 7:30 Colonoscopy can be left where it is She will need to be notified She is to remain on twice a day Nexium and Carafate 4 times daily until procedure No naproxen JMP

## 2016-08-24 NOTE — ED Notes (Signed)
PT did not have to use RR at this time  

## 2016-08-24 NOTE — Addendum Note (Signed)
Addended by: Richardson ChiquitoSMITH, Kaelen Caughlin N on: 08/24/2016 12:08 PM   Modules accepted: Orders

## 2016-08-24 NOTE — ED Provider Notes (Signed)
[facility]-EMERGENCY DEPT Provider Note   CSN: [id number] Arrival date & time: 08/24/16  0630     History   Chief Complaint Chief Complaint  Patient presents with  . Abdominal Pain    HPI Gabriela Benitez is a 36 y.o. female.  The history is provided by the patient.  Abdominal Pain   This is a new problem. Episode onset: 1 month. Episode frequency: intermittent. The problem has been gradually worsening. The pain is associated with eating (prolonged NSAID use). The pain is located in the LUQ and epigastric region. The quality of the pain is aching. The pain is at a severity of 2/10 (currently). Associated symptoms include anorexia (due to post prandial pain) and nausea. Pertinent negatives include fever, diarrhea, hematochezia, melena, vomiting and constipation. The symptoms are aggravated by eating. Nothing relieves the symptoms. Past workup includes GI consult (scheduled of outpatient EGD and Colonscopy). Past medical history comments: s/p cholecystectomy.     Past Medical History:  Diagnosis Date  . Back pain   . Carpal tunnel syndrome on both sides   . Chronic back pain   . Chronic neck pain   . Elevated LFTs    acute pyleonephritis  . Esophageal spasm    uses aloe vera with success  . GERD (gastroesophageal reflux disease)    Zantac  . Migraine     There are no active problems to display for this patient.   Past Surgical History:  Procedure Laterality Date  . CESAREAN SECTION  2007  . CESAREAN SECTION  01/27/2012   Procedure: CESAREAN SECTION;  Surgeon: Delbert Harnessaniel H Moore, MD;  Location: [facility];  Service: Gynecology;  Laterality: N/A;  . CHOLECYSTECTOMY    . DILATION AND CURETTAGE OF UTERUS    . NOSE SURGERY    . TONSILLECTOMY      OB History    Gravida Para Term Preterm AB Living   3 2 2   1 2    SAB TAB Ectopic Multiple Live Births   1       2       Home Medications    Prior to Admission medications   Medication Sig Start Date End Date Taking? Authorizing  Provider  AMBULATORY NON FORMULARY MEDICATION Medication Name: Truvision Multivitamin-1 tablet daily    Historical Provider, MD  clonazePAM (KLONOPIN) 0.5 MG tablet TK 1 T PO BID PRN 07/08/16   Historical Provider, MD  clonazePAM (KLONOPIN) 1 MG tablet TK 1 T PO BID PRN 08/12/16   Historical Provider, MD  diazepam (VALIUM) 5 MG tablet TK 1 T BID AND 1-2 TS AT NIGHT PRF ANXIETY 07/08/16   Historical Provider, MD  EPINEPHrine 0.3 mg/0.3 mL IJ SOAJ injection Inject 0.3 mLs (0.3 mg total) into the muscle once. 12/26/15   Doug SouSam Jacubowitz, MD  esomeprazole (NEXIUM) 40 MG capsule Take 1 capsule (40 mg total) by mouth 2 (two) times daily before a meal. 08/21/16   Beverley FiedlerJay M Pyrtle, MD  hydrOXYzine (ATARAX/VISTARIL) 50 MG tablet TK 1 T PO TID PRF SEVERE ANXIETY 08/12/16   Historical Provider, MD  Na Sulfate-K Sulfate-Mg Sulf 17.5-3.13-1.6 GM/180ML SOLN Suprep-Use as directed 08/21/16   Beverley FiedlerJay M Pyrtle, MD  QUEtiapine (SEROQUEL) 25 MG tablet TK 1 T PO ATN FOR 3 NTS THEN TK 2 TS PO ATN 08/12/16   Historical Provider, MD  ranitidine (ZANTAC) 150 MG tablet Take 150 mg by mouth 2 (two) times daily.    Historical Provider, MD  sucralfate (CARAFATE) 1 g tablet Take 1 tablet (  1 g total) by mouth 4 (four) times daily -  with meals and at bedtime. 08/21/16   Beverley Fiedler, MD    Family History No family history on file.  Social History Social History  Substance Use Topics  . Smoking status: Former Smoker    Packs/day: 0.25    Years: 11.00    Types: Cigarettes, E-cigarettes  . Smokeless tobacco: Never Used  . Alcohol use No     Allergies   Latex; Miralax [polyethylene glycol]; Codeine; and Eggs or egg-derived products   Review of Systems Review of Systems  Constitutional: Negative for fever.  Gastrointestinal: Positive for abdominal pain, anorexia (due to post prandial pain) and nausea. Negative for constipation, diarrhea, hematochezia, melena and vomiting.  Ten systems are reviewed and are negative for acute change  except as noted in the HPI    Physical Exam Updated Vital Signs BP 106/71   Pulse (!) 58   Temp 97.6 F (36.4 C) (Oral)   Resp 17   Ht 5\' 4"  (1.626 m)   Wt 190 lb (86.2 kg)   SpO2 100%   BMI 32.61 kg/m   Physical Exam  Constitutional: She is oriented to person, place, and time. She appears well-developed and well-nourished. No distress.  HENT:  Head: Normocephalic and atraumatic.  Nose: Nose normal.  Eyes: Conjunctivae and EOM are normal. Pupils are equal, round, and reactive to light. Right eye exhibits no discharge. Left eye exhibits no discharge. No scleral icterus.  Neck: Normal range of motion. Neck supple.  Cardiovascular: Normal rate and regular rhythm.  Exam reveals no gallop and no friction rub.   No murmur heard. Pulmonary/Chest: Effort normal and breath sounds normal. No stridor. No respiratory distress. She has no rales.  Abdominal: Soft. She exhibits no distension. There is no tenderness. There is no rigidity, no rebound, no guarding and no CVA tenderness.  Musculoskeletal: She exhibits no edema or tenderness.  Neurological: She is alert and oriented to person, place, and time.  Skin: Skin is warm and dry. No rash noted. She is not diaphoretic. No erythema.  Psychiatric: She has a normal mood and affect.  Vitals reviewed.    ED Treatments / Results  Labs (all labs ordered are listed, but only abnormal results are displayed) Labs Reviewed  COMPREHENSIVE METABOLIC PANEL - Abnormal; Notable for the following:       Result Value   Creatinine, Ser 1.06 (*)    All other components within normal limits  LIPASE, BLOOD  CBC  POC OCCULT BLOOD, ED    EKG  EKG Interpretation None       Radiology No results found.  Procedures Procedures (including critical care time)  Medications Ordered in ED Medications  sucralfate (CARAFATE) 1 GM/10ML suspension 1 g (not administered)  gi cocktail (Maalox,Lidocaine,Donnatal) (not administered)     Initial  Impression / Assessment and Plan / ED Course  I have reviewed the triage vital signs and the nursing notes.  Pertinent labs & imaging results that were available during my care of the patient were reviewed by me and considered in my medical decision making (see chart for details).     The patient appears well, in no acute distress, without evidence of toxicity or dehydration. They are interactive following commands. Abd benign. Labs grossly reassuring with negative hemoccult. Discussed case and findings with Dr. Rhea Belton who will move up her EGD.  The patient is safe for discharge with strict return precautions.  Final Clinical Impressions(s) / ED  Diagnoses   Final diagnoses:  Left upper quadrant pain   Disposition: Discharge  Condition: Good  I have discussed the results, Dx and Tx plan with the patient who expressed understanding and agree(s) with the plan. Discharge instructions discussed at great length. The patient was given strict return precautions who verbalized understanding of the instructions. No further questions at time of discharge.    New Prescriptions   No medications on file    Follow Up: Beverley Fiedler, MD [address] [place] [address] [phone]   they will be in contact to move up your EGD. please call them if you have not heard from them in 3-4 days.      Nira Conn, MD 08/24/16 [phone]

## 2016-08-24 NOTE — Telephone Encounter (Signed)
I sent a note to Dottie about separating endoscopy from colonoscopy so that endoscopy can be done next week She needs to remain on a very bland diet and use twice a day Nexium and Carafate every 6 hours Avoid all NSAIDs

## 2016-08-24 NOTE — Telephone Encounter (Signed)
Patient is advised of Dr Lauro FranklinPyrtle's recommendations and is advised of endoscopy being scheduled now for 09/03/16 @ 7:30 am with 7 am arrival. Clear liquids after midnight before procedure and npo after 4:30 am morning of procedure. Written instructions have been mailed to her home. I have instructed her that colonoscopy date/time/location and prep instructions will all remain the same. She verbalizes understanding of all of this.

## 2016-08-24 NOTE — Telephone Encounter (Signed)
Pt states she went to the ER last night and they did not do a CT or EGD. States she took 4 valium and seroquel at 8:30pm and woke up at 3:30am with terrible pain. States she took the medication on an empty stomach and the ER told her maybe the pain was from taking it with no food. Pt wants to know what she can do. Please advise.

## 2016-08-24 NOTE — Telephone Encounter (Signed)
Pt called x 2 during overnight call Initially to see if she could take Nexium and Carafate without food. I instructed she could  2nd call was to say epigastric/LUQ pain much worse Pt advised to go to the ED for eval and admission if felt indicated She voiced understanding

## 2016-08-31 ENCOUNTER — Emergency Department (HOSPITAL_COMMUNITY)
Admission: EM | Admit: 2016-08-31 | Discharge: 2016-08-31 | Disposition: A | Discharge status: Home / Self Care | Payer: Medicaid Other | Attending: Emergency Medicine | Admitting: Emergency Medicine

## 2016-08-31 ENCOUNTER — Telehealth: Payer: Self-pay | Admitting: Internal Medicine

## 2016-08-31 ENCOUNTER — Encounter (HOSPITAL_COMMUNITY): Payer: Self-pay | Admitting: Emergency Medicine

## 2016-08-31 ENCOUNTER — Emergency Department (HOSPITAL_COMMUNITY): Payer: Medicaid Other

## 2016-08-31 DIAGNOSIS — Z91040 Latex allergy status: Secondary | ICD-10-CM | POA: Diagnosis not present

## 2016-08-31 DIAGNOSIS — Z79899 Other long term (current) drug therapy: Secondary | ICD-10-CM | POA: Diagnosis not present

## 2016-08-31 DIAGNOSIS — R1010 Upper abdominal pain, unspecified: Secondary | ICD-10-CM | POA: Diagnosis present

## 2016-08-31 DIAGNOSIS — K279 Peptic ulcer, site unspecified, unspecified as acute or chronic, without hemorrhage or perforation: Secondary | ICD-10-CM

## 2016-08-31 DIAGNOSIS — Z87891 Personal history of nicotine dependence: Secondary | ICD-10-CM | POA: Insufficient documentation

## 2016-08-31 DIAGNOSIS — R1084 Generalized abdominal pain: Secondary | ICD-10-CM | POA: Diagnosis not present

## 2016-08-31 LAB — URINALYSIS, ROUTINE W REFLEX MICROSCOPIC
BILIRUBIN URINE: NEGATIVE
Glucose, UA: NEGATIVE mg/dL
Hgb urine dipstick: NEGATIVE
KETONES UR: 5 mg/dL — AB
Nitrite: NEGATIVE
Protein, ur: 30 mg/dL — AB
Specific Gravity, Urine: 1.02 (ref 1.005–1.030)
pH: 5 (ref 5.0–8.0)

## 2016-08-31 LAB — COMPREHENSIVE METABOLIC PANEL
ALBUMIN: 4.1 g/dL (ref 3.5–5.0)
ALK PHOS: 69 U/L (ref 38–126)
ALT: 17 U/L (ref 14–54)
AST: 21 U/L (ref 15–41)
Anion gap: 9 (ref 5–15)
BUN: 11 mg/dL (ref 6–20)
CALCIUM: 9.2 mg/dL (ref 8.9–10.3)
CHLORIDE: 108 mmol/L (ref 101–111)
CO2: 23 mmol/L (ref 22–32)
CREATININE: 0.92 mg/dL (ref 0.44–1.00)
GFR calc non Af Amer: >60 mL/min (ref >60)
GLUCOSE: 98 mg/dL (ref 65–99)
Potassium: 3.4 mmol/L — ABNORMAL LOW (ref 3.5–5.1)
SODIUM: 140 mmol/L (ref 135–145)
Total Bilirubin: 0.8 mg/dL (ref 0.3–1.2)
Total Protein: 6.5 g/dL (ref 6.5–8.1)

## 2016-08-31 LAB — I-STAT BETA HCG BLOOD, ED (MC, WL, AP ONLY)

## 2016-08-31 LAB — CBC
HCT: 40.6 % (ref 36.0–46.0)
Hemoglobin: 14 g/dL (ref 12.0–15.0)
MCH: 30.5 pg (ref 26.0–34.0)
MCHC: 34.5 g/dL (ref 30.0–36.0)
MCV: 88.5 fL (ref 78.0–100.0)
PLATELETS: 285 10*3/uL (ref 150–400)
RBC: 4.59 MIL/uL (ref 3.87–5.11)
RDW: 13.5 % (ref 11.5–15.5)
WBC: 5.9 10*3/uL (ref 4.0–10.5)

## 2016-08-31 LAB — LIPASE, BLOOD: LIPASE: 21 U/L (ref 11–51)

## 2016-08-31 MED ORDER — ONDANSETRON 4 MG PO TBDP: 4.0000 mg | ORAL_TABLET | Freq: Three times a day (TID) | ORAL | 0 refills | Status: DC | PRN

## 2016-08-31 MED ORDER — GI COCKTAIL ~~LOC~~
30.0000 mL | Freq: Once | ORAL | Status: AC
  Administered 2016-08-31: 30 mL via ORAL
  Filled 2016-08-31: qty 30

## 2016-08-31 MED ORDER — SUCRALFATE 1 GM/10ML PO SUSP: 1.0000 g | Freq: Three times a day (TID) | ORAL | 0 refills | Status: DC

## 2016-08-31 MED ORDER — HYDROCODONE-ACETAMINOPHEN 5-325 MG PO TABS
2.0000 | ORAL_TABLET | Freq: Once | ORAL | Status: AC
  Administered 2016-08-31: 2 via ORAL
  Filled 2016-08-31: qty 2

## 2016-08-31 MED ORDER — HYDROCODONE-ACETAMINOPHEN 5-325 MG PO TABS: 2.0000 | ORAL_TABLET | ORAL | 0 refills | Status: DC | PRN

## 2016-08-31 MED ORDER — ONDANSETRON 4 MG PO TBDP
4.0000 mg | ORAL_TABLET | Freq: Once | ORAL | Status: AC
  Administered 2016-08-31: 4 mg via ORAL
  Filled 2016-08-31: qty 1

## 2016-08-31 NOTE — Telephone Encounter (Signed)
Returned call to pt and she states she is in the ER, states she is in a lot of pain.

## 2016-08-31 NOTE — ED Triage Notes (Signed)
Pt here for abd pain; pt sts hx of ulcers but taking meds without relief

## 2016-08-31 NOTE — ED Notes (Signed)
Patient transported to X-ray 

## 2016-08-31 NOTE — Discharge Instructions (Signed)
Absolutely no anti infmalmmatory medications, alcohol, tobacco, or caffeine. Small frequent meals. Bland diet. Keep your appointment with GI for Endoscopy on Thursday.

## 2016-08-31 NOTE — ED Provider Notes (Signed)
[facility]-EMERGENCY DEPT Provider Note   CSN: [id number] Arrival date & time: 08/31/16  1118     History   Chief Complaint Chief Complaint  Patient presents with  . Abdominal Pain    HPI Gabriela Benitez is a 36 y.o. female. Chief complaint is upper abdominal pain. Patient has a history of ulcer many years ago. Started having recurrence about 4 or 5 weeks ago of epigastric and bilateral upper quadrant pain.  Described as a burning. Seemed to "move around". Mild nausea but no vomiting. Poor appetite. Is made worse plan will see me by mouth over the last several days. Seen and evaluated here with reassuring labs 4 days ago. Has endoscopy in 4 days from now.  Is having no stigmata of bleeding. No black or bloody stools. Was taking Aleve several times per day until seeing GI approximately 8 days ago. Was consuming alcohol every other day on average of 2-4 drinks every other day. Has continued this. Caution against this. Does not smoke. Takes occasional caffeine.  HPI  Past Medical History:  Diagnosis Date  . Back pain   . Carpal tunnel syndrome on both sides   . Chronic back pain   . Chronic neck pain   . Elevated LFTs    acute pyleonephritis  . Esophageal spasm    uses aloe vera with success  . GERD (gastroesophageal reflux disease)    Zantac  . Migraine     There are no active problems to display for this patient.   Past Surgical History:  Procedure Laterality Date  . CESAREAN SECTION  2007  . CESAREAN SECTION  01/27/2012   Procedure: CESAREAN SECTION;  Surgeon: Delbert Harness, MD;  Location: [facility];  Service: Gynecology;  Laterality: N/A;  . CHOLECYSTECTOMY    . DILATION AND CURETTAGE OF UTERUS    . NOSE SURGERY    . TONSILLECTOMY      OB History    Gravida Para Term Preterm AB Living   3 2 2   1 2    SAB TAB Ectopic Multiple Live Births   1       2       Home Medications    Prior to Admission medications   Medication Sig Start Date End Date Taking? Authorizing  Provider  AMBULATORY NON FORMULARY MEDICATION Medication Name: Truvision Multivitamin-1 tablet daily    Historical Provider, MD  clonazePAM (KLONOPIN) 0.5 MG tablet TK 1 T PO BID PRN 07/08/16   Historical Provider, MD  clonazePAM (KLONOPIN) 1 MG tablet TK 1 T PO BID PRN 08/12/16   Historical Provider, MD  diazepam (VALIUM) 5 MG tablet TK 1 T BID AND 1-2 TS AT NIGHT PRF ANXIETY 07/08/16   Historical Provider, MD  EPINEPHrine 0.3 mg/0.3 mL IJ SOAJ injection Inject 0.3 mLs (0.3 mg total) into the muscle once. 12/26/15   Doug Sou, MD  esomeprazole (NEXIUM) 40 MG capsule Take 1 capsule (40 mg total) by mouth 2 (two) times daily before a meal. 08/21/16   Beverley Fiedler, MD  HYDROcodone-acetaminophen (NORCO/VICODIN) 5-325 MG tablet Take 2 tablets by mouth every 4 (four) hours as needed. 08/31/16   Rolland Porter, MD  hydrOXYzine (ATARAX/VISTARIL) 50 MG tablet TK 1 T PO TID PRF SEVERE ANXIETY 08/12/16   Historical Provider, MD  Na Sulfate-K Sulfate-Mg Sulf 17.5-3.13-1.6 GM/180ML SOLN Suprep-Use as directed 08/21/16   Beverley Fiedler, MD  ondansetron (ZOFRAN ODT) 4 MG disintegrating tablet Take 1 tablet (4 mg total) by mouth every 8 (  eight) hours as needed for nausea. 08/31/16   Rolland Porter, MD  QUEtiapine (SEROQUEL) 25 MG tablet TK 1 T PO ATN FOR 3 NTS THEN TK 2 TS PO ATN 08/12/16   Historical Provider, MD  ranitidine (ZANTAC) 150 MG tablet Take 150 mg by mouth 2 (two) times daily.    Historical Provider, MD  sucralfate (CARAFATE) 1 GM/10ML suspension Take 10 mLs (1 g total) by mouth 4 (four) times daily -  with meals and at bedtime. 08/31/16   Rolland Porter, MD    Family History History reviewed. No pertinent family history.  Social History Social History  Substance Use Topics  . Smoking status: Former Smoker    Packs/day: 0.25    Years: 11.00    Types: Cigarettes, E-cigarettes  . Smokeless tobacco: Never Used  . Alcohol use No     Allergies   Latex; Miralax [polyethylene glycol]; Codeine; and Eggs or  egg-derived products   Review of Systems Review of Systems   Physical Exam Updated Vital Signs BP 130/67   Pulse (!) 43   Temp 97.5 F (36.4 C) (Oral)   Resp 20   SpO2 100%   Physical Exam  Constitutional: She is oriented to person, place, and time. She appears well-developed and well-nourished. No distress.  HENT:  Head: Normocephalic.  Eyes: Conjunctivae are normal. Pupils are equal, round, and reactive to light. No scleral icterus.  Neck: Normal range of motion. Neck supple. No thyromegaly present.  Cardiovascular: Normal rate and regular rhythm.  Exam reveals no gallop and no friction rub.   No murmur heard. Pulmonary/Chest: Effort normal and breath sounds normal. No respiratory distress. She has no wheezes. She has no rales.  Abdominal: Soft. Bowel sounds are normal. She exhibits no distension. There is tenderness. There is no rebound.    Diffuse upper abdominal tenderness. No guarding, rebound, or peritoneal rotation. Conjunctiva are not pale. Nailbeds not pale. Is not hypotensive or tachycardic.  Musculoskeletal: Normal range of motion.  Neurological: She is alert and oriented to person, place, and time.  Skin: Skin is warm and dry. No rash noted.  Psychiatric: She has a normal mood and affect. Her behavior is normal.     ED Treatments / Results  Labs (all labs ordered are listed, but only abnormal results are displayed) Labs Reviewed  COMPREHENSIVE METABOLIC PANEL - Abnormal; Notable for the following:       Result Value   Potassium 3.4 (*)    All other components within normal limits  URINALYSIS, ROUTINE W REFLEX MICROSCOPIC - Abnormal; Notable for the following:    APPearance HAZY (*)    Ketones, ur 5 (*)    Protein, ur 30 (*)    Leukocytes, UA SMALL (*)    Bacteria, UA RARE (*)    Squamous Epithelial / LPF 6-30 (*)    All other components within normal limits  LIPASE, BLOOD  CBC  I-STAT BETA HCG BLOOD, ED ([facility], [facility], [facility] ONLY)    EKG  EKG  Interpretation None       Radiology Dg Abd Acute W/chest  Result Date: 08/31/2016 CLINICAL DATA:  Left upper abdominal pain, diarrhea. EXAM: DG ABDOMEN ACUTE W/ 1V CHEST COMPARISON:  CT scan of May 19, 2015. FINDINGS: There is no evidence of dilated bowel loops or free intraperitoneal air. No radiopaque calculi or other significant radiographic abnormality is seen. Heart size and mediastinal contours are within normal limits. Both lungs are clear. Status post cholecystectomy. IMPRESSION: No evidence of bowel  obstruction or ileus. No acute cardiopulmonary disease. Electronically Signed   By: Lupita RaiderJames  Green Jr, M.D.   On: 08/31/2016 14:12    Procedures Procedures (including critical care time)  Medications Ordered in ED Medications  gi cocktail (Maalox,Lidocaine,Donnatal) (30 mLs Oral Given 08/31/16 1244)  ondansetron (ZOFRAN-ODT) disintegrating tablet 4 mg (4 mg Oral Given 08/31/16 1244)  HYDROcodone-acetaminophen (NORCO/VICODIN) 5-325 MG per tablet 2 tablet (2 tablets Oral Given 08/31/16 1243)     Initial Impression / Assessment and Plan / ED Course  I have reviewed the triage vital signs and the nursing notes.  Pertinent labs & imaging results that were available during my care of the patient were reviewed by me and considered in my medical decision making (see chart for details).     Symptoms still consistent with probable peptic-related problem. Patient was still consuming alcohol and anti-inflammatories until the last 4-5 days, thus not supervising her symptoms have not completely resolved. We'll obtain x-ray to rule out obstruction or perforation. Recheck hemoglobin for sign of bleeding. We'll reassess after the above.  Final Clinical Impressions(s) / ED Diagnoses   Final diagnoses:  Generalized abdominal pain  Peptic ulcer   Patient Norma hemoglobin. Normal hepatobiliary enzymes. Normal x-ray. Exam, history, and test results show no sign of complication IE no perforation,  obstruction, or hemoglobin dropped to suggest hemorrhage. Symptoms still highly suggestive of a peptic ulcer. We will rewrite Carafate as a liquid. She feels she may tolerate this better. She has a endoscopy scheduled on Thursday. We'll give her limited amount of pain medication until that time. Otherwise continue her PPI. Absolute no alcohol, tobacco, caffeine, or anti-inflammatories.  ER with any acute changes.   New Prescriptions New Prescriptions   HYDROCODONE-ACETAMINOPHEN (NORCO/VICODIN) 5-325 MG TABLET    Take 2 tablets by mouth every 4 (four) hours as needed.   ONDANSETRON (ZOFRAN ODT) 4 MG DISINTEGRATING TABLET    Take 1 tablet (4 mg total) by mouth every 8 (eight) hours as needed for nausea.   SUCRALFATE (CARAFATE) 1 GM/10ML SUSPENSION    Take 10 mLs (1 g total) by mouth 4 (four) times daily -  with meals and at bedtime.     Rolland PorterMark Josefine Fuhr, MD 08/31/16 1425

## 2016-09-01 ENCOUNTER — Telehealth: Payer: Self-pay | Admitting: *Deleted

## 2016-09-01 NOTE — Telephone Encounter (Signed)
-----   Message from Beverley FiedlerJay M Pyrtle, MD sent at 09/01/2016  5:19 PM EST ----- Regarding: Records I have received upper endoscopy and colonoscopy records on Ms. Blasdell  Colonoscopy was performed on 07/24/2008. This was a normal exam throughout the distal terminal ileum and colon. The prep was "very good". There were no polyps or evidence of colitis  For this reason she does not need polyp surveillance colonoscopy. Her colonoscopy scheduled for later this week and be canceled and we can proceed with EGD.  Repeat colon for screening at age 36.  JMP

## 2016-09-02 NOTE — Telephone Encounter (Signed)
Patient has been advised of the information and recommendations given by Dr Rhea BeltonPyrtle. However, she requests that we keep colonoscopy scheduled at this time until she speaks with Dr Rhea BeltonPyrtle at her endoscopy tomorrow because she still prefers to keep colonoscopy appointment.

## 2016-09-03 ENCOUNTER — Ambulatory Visit (AMBULATORY_SURGERY_CENTER): Payer: Medicaid Other | Admitting: Internal Medicine

## 2016-09-03 ENCOUNTER — Encounter: Payer: Self-pay | Admitting: Internal Medicine

## 2016-09-03 VITALS — BP 111/63 | HR 56 | Temp 97.5°F | Resp 24 | Ht 64.0 in | Wt 190.0 lb

## 2016-09-03 DIAGNOSIS — K3189 Other diseases of stomach and duodenum: Secondary | ICD-10-CM | POA: Diagnosis not present

## 2016-09-03 DIAGNOSIS — R1012 Left upper quadrant pain: Secondary | ICD-10-CM

## 2016-09-03 MED ORDER — SODIUM CHLORIDE 0.9 % IV SOLN: 500.0000 mL | INTRAVENOUS | Status: AC

## 2016-09-03 NOTE — Op Note (Signed)
[facility] Patient Name: Gabriela Benitez Procedure Date: 09/03/2016 7:49 AM MRN: [id number] Endoscopist: Beverley Fiedler , MD Age: 36 Referring MD:  Date of Birth: [birth year] Gender: Female Account #: [id number] Procedure:                Upper GI endoscopy Indications:              Epigastric abdominal pain, Abdominal pain in the                            left upper quadrant Medicines:                Monitored Anesthesia Care Procedure:                Pre-Anesthesia Assessment:                           - Prior to the procedure, a History and Physical                            was performed, and patient medications and                            allergies were reviewed. The patient's tolerance of                            previous anesthesia was also reviewed. The risks                            and benefits of the procedure and the sedation                            options and risks were discussed with the patient.                            All questions were answered, and informed consent                            was obtained. Prior Anticoagulants: The patient has                            taken no previous anticoagulant or antiplatelet                            agents. ASA Grade Assessment: II - A patient with                            mild systemic disease. After reviewing the risks                            and benefits, the patient was deemed in                            satisfactory condition to undergo the procedure.  After obtaining informed consent, the endoscope was                            passed under direct vision. Throughout the                            procedure, the patient's blood pressure, pulse, and                            oxygen saturations were monitored continuously. The                            Endoscope was introduced through the mouth, and                            advanced to the second part of  duodenum. The upper                            GI endoscopy was accomplished without difficulty.                            The patient tolerated the procedure well. Scope In: Scope Out: Findings:                 The examined esophagus was normal.                           The Z-line was regular and was found 36 cm from the                            incisors.                           The entire examined stomach was normal. Biopsies                            were taken with a cold forceps for histology and                            Helicobacter pylori testing.                           The examined duodenum was normal. Biopsies for                            histology were taken with a cold forceps for                            evaluation of celiac disease. Biopsies for                            histology were taken with a cold forceps for                            evaluation of celiac  disease. Complications:            No immediate complications. Estimated Blood Loss:     Estimated blood loss was minimal. Impression:               - Normal esophagus. Z-line regular, 36 cm from the                            incisors.                           - Normal stomach. Biopsied.                           - Normal examined duodenum. Biopsied. Recommendation:           - Patient has a contact number available for                            emergencies. The signs and symptoms of potential                            delayed complications were discussed with the                            patient. Return to normal activities tomorrow.                            Written discharge instructions were provided to the                            patient.                           - Resume previous diet.                           - Continue present medications.                           - Await pathology results.                           - If pathology results unrevealing, can proceed                             with colonoscopy for further evaluation of upper                            and now lower abdominal pain. Colonoscopy is                            already scheduled (it is not indicated for polyp                            surveillance as records indicate a normal  colonoscopy in 2011). Finally if workup unrevealing                            then consider cross-sectional imaging to complete                            work-up. Beverley Fiedler, MD 09/03/2016 8:16:28 AM This report has been signed electronically.

## 2016-09-03 NOTE — Progress Notes (Signed)
Pt requested refill of her pain rx,  Dr. Fayrene FearingJames had given her previously prescribe.  Per Dr. Rhea BeltonPyrtle he suggested pt take tylenol no more than 4 grams per day.  Maw   Pt was put back on the schedule for colonoscopy for 09-17-16 2:00.  Reprinted prep instructions and Dr. Rhea BeltonPyrtle reported pt Gabriela Benitez had went over pre- visit info, prep info yesterday.  Maw   No problems noted in the recovery room. maw

## 2016-09-03 NOTE — Patient Instructions (Signed)
YOU HAD AN ENDOSCOPIC PROCEDURE TODAY AT THE [facility]:   Refer to the procedure report that was given to you for any specific questions about what was found during the examination.  If the procedure report does not answer your questions, please call your gastroenterologist to clarify.  If you requested that your care partner not be given the details of your procedure findings, then the procedure report has been included in a sealed envelope for you to review at your convenience later.  YOU SHOULD EXPECT: Some feelings of bloating in the abdomen. Passage of more gas than usual.  Walking can help get rid of the air that was put into your GI tract during the procedure and reduce the bloating. If you had a lower endoscopy (such as a colonoscopy or flexible sigmoidoscopy) you may notice spotting of blood in your stool or on the toilet paper. If you underwent a bowel prep for your procedure, you may not have a normal bowel movement for a few days.  Please Note:  You might notice some irritation and congestion in your nose or some drainage.  This is from the oxygen used during your procedure.  There is no need for concern and it should clear up in a day or so.  SYMPTOMS TO REPORT IMMEDIATELY:   Following lower endoscopy (colonoscopy or flexible sigmoidoscopy):  Excessive amounts of blood in the stool  Significant tenderness or worsening of abdominal pains  Swelling of the abdomen that is new, acute  Fever of 100F or higher   Following upper endoscopy (EGD)  Vomiting of blood or coffee ground material  New chest pain or pain under the shoulder blades  Painful or persistently difficult swallowing  New shortness of breath  Fever of 100F or higher  Black, tarry-looking stools  For urgent or emergent issues, a gastroenterologist can be reached at any hour by calling [phone].   DIET:  We do recommend a small meal at first, but then you may proceed to your regular diet.  Drink  plenty of fluids but you should avoid alcoholic beverages for 24 hours.  ACTIVITY:  You should plan to take it easy for the rest of today and you should NOT DRIVE or use heavy machinery until tomorrow (because of the sedation medicines used during the test).    FOLLOW UP: Our staff will call the number listed on your records the next business day following your procedure to check on you and address any questions or concerns that you may have regarding the information given to you following your procedure. If we do not reach you, we will leave a message.  However, if you are feeling well and you are not experiencing any problems, there is no need to return our call.  We will assume that you have returned to your regular daily activities without incident.  If any biopsies were taken you will be contacted by phone or by letter within the next 1-3 weeks.  Please call us at [phone] if you have not heard about the biopsies in 3 weeks.    SIGNATURES/CONFIDENTIALITY: You and/or your care partner have signed paperwork which will be entered into your electronic medical record.  These signatures attest to the fact that that the information above on your After Visit Summary has been reviewed and is understood.  Full responsibility of the confidentiality of this discharge information lies with you and/or your care-partner.    You may resume your current medications today. Await biopsy   results. Please call if any questions or concerns.     

## 2016-09-03 NOTE — Progress Notes (Signed)
No soy allergy known to patient - eggs cause migraines - no SOB, no anaphylaxsis No issues with past sedation with any surgeries  or procedures, no intubation problems  No diet pills per patient No home 02 use per patient  No blood thinners per patient  Pt denies issues with constipation  No A fib or A flutter

## 2016-09-03 NOTE — Progress Notes (Signed)
Called to room to assist during endoscopic procedure.  Patient ID and intended procedure confirmed with present staff. Received instructions for my participation in the procedure from the performing physician.  

## 2016-09-03 NOTE — Progress Notes (Signed)
A/ox3 pleased with [facility], report to Annette RN 

## 2016-09-04 ENCOUNTER — Telehealth: Payer: Self-pay

## 2016-09-04 NOTE — Telephone Encounter (Signed)
  Follow up Call-  Call back number 09/03/2016  Post procedure Call Back phone  # [phone] mother- pt [phone]  Permission to leave phone message Yes  Some recent data might be hidden    Patient was called for follow up after her procedure on 09/03/16. I spoke with the patients mother and she states as far as she knows Gabriela BradfordKimberly is doing fine following her procedure.

## 2016-09-06 ENCOUNTER — Emergency Department (HOSPITAL_BASED_OUTPATIENT_CLINIC_OR_DEPARTMENT_OTHER)
Admission: EM | Admit: 2016-09-06 | Discharge: 2016-09-06 | Disposition: A | Discharge status: Home / Self Care | Payer: Medicaid Other | Attending: Emergency Medicine | Admitting: Emergency Medicine

## 2016-09-06 ENCOUNTER — Encounter (HOSPITAL_BASED_OUTPATIENT_CLINIC_OR_DEPARTMENT_OTHER): Payer: Self-pay | Admitting: Emergency Medicine

## 2016-09-06 DIAGNOSIS — Z87891 Personal history of nicotine dependence: Secondary | ICD-10-CM | POA: Diagnosis not present

## 2016-09-06 DIAGNOSIS — R1010 Upper abdominal pain, unspecified: Secondary | ICD-10-CM | POA: Diagnosis not present

## 2016-09-06 DIAGNOSIS — R112 Nausea with vomiting, unspecified: Secondary | ICD-10-CM | POA: Insufficient documentation

## 2016-09-06 LAB — URINALYSIS, ROUTINE W REFLEX MICROSCOPIC
BILIRUBIN URINE: NEGATIVE
GLUCOSE, UA: NEGATIVE mg/dL
HGB URINE DIPSTICK: NEGATIVE
KETONES UR: 40 mg/dL — AB
Leukocytes, UA: NEGATIVE
Nitrite: NEGATIVE
PROTEIN: NEGATIVE mg/dL
Specific Gravity, Urine: 1.005 (ref 1.005–1.030)
pH: 6.5 (ref 5.0–8.0)

## 2016-09-06 LAB — CBC
HCT: 37.9 % (ref 36.0–46.0)
HEMOGLOBIN: 13.5 g/dL (ref 12.0–15.0)
MCH: 30.7 pg (ref 26.0–34.0)
MCHC: 35.6 g/dL (ref 30.0–36.0)
MCV: 86.1 fL (ref 78.0–100.0)
Platelets: 319 10*3/uL (ref 150–400)
RBC: 4.4 MIL/uL (ref 3.87–5.11)
RDW: 13 % (ref 11.5–15.5)
WBC: 5.8 10*3/uL (ref 4.0–10.5)

## 2016-09-06 LAB — COMPREHENSIVE METABOLIC PANEL
ALK PHOS: 68 U/L (ref 38–126)
ALT: 19 U/L (ref 14–54)
AST: 23 U/L (ref 15–41)
Albumin: 4.2 g/dL (ref 3.5–5.0)
Anion gap: 10 (ref 5–15)
BILIRUBIN TOTAL: 0.6 mg/dL (ref 0.3–1.2)
BUN: 8 mg/dL (ref 6–20)
CALCIUM: 9.1 mg/dL (ref 8.9–10.3)
CO2: 20 mmol/L — ABNORMAL LOW (ref 22–32)
Chloride: 110 mmol/L (ref 101–111)
Creatinine, Ser: 0.8 mg/dL (ref 0.44–1.00)
GLUCOSE: 87 mg/dL (ref 65–99)
POTASSIUM: 3.2 mmol/L — AB (ref 3.5–5.1)
Sodium: 140 mmol/L (ref 135–145)
TOTAL PROTEIN: 6.8 g/dL (ref 6.5–8.1)

## 2016-09-06 LAB — PREGNANCY, URINE: PREG TEST UR: NEGATIVE

## 2016-09-06 LAB — LIPASE, BLOOD: Lipase: 32 U/L (ref 11–51)

## 2016-09-06 MED ORDER — ONDANSETRON HCL 4 MG/2ML IJ SOLN
4.0000 mg | Freq: Once | INTRAMUSCULAR | Status: AC
  Administered 2016-09-06: 4 mg via INTRAVENOUS
  Filled 2016-09-06: qty 2

## 2016-09-06 MED ORDER — MORPHINE SULFATE (PF) 4 MG/ML IV SOLN
4.0000 mg | Freq: Once | INTRAVENOUS | Status: AC
  Administered 2016-09-06: 4 mg via INTRAVENOUS
  Filled 2016-09-06: qty 1

## 2016-09-06 MED ORDER — MORPHINE SULFATE (PF) 4 MG/ML IV SOLN
4.0000 mg | Freq: Once | INTRAVENOUS | Status: DC
  Filled 2016-09-06: qty 1

## 2016-09-06 MED ORDER — PANTOPRAZOLE SODIUM 40 MG IV SOLR
40.0000 mg | Freq: Once | INTRAVENOUS | Status: AC
  Administered 2016-09-06: 40 mg via INTRAVENOUS
  Filled 2016-09-06: qty 40

## 2016-09-06 MED ORDER — GI COCKTAIL ~~LOC~~
30.0000 mL | Freq: Once | ORAL | Status: AC
  Administered 2016-09-06: 30 mL via ORAL

## 2016-09-06 MED ORDER — GI COCKTAIL ~~LOC~~
ORAL | Status: DC
Start: 2016-09-06 — End: 2016-09-07
  Filled 2016-09-06: qty 30

## 2016-09-06 MED ORDER — FENTANYL CITRATE (PF) 100 MCG/2ML IJ SOLN
50.0000 ug | Freq: Once | INTRAMUSCULAR | Status: AC
  Administered 2016-09-06: 50 ug via INTRAVENOUS
  Filled 2016-09-06: qty 2

## 2016-09-06 MED ORDER — SODIUM CHLORIDE 0.9 % IV BOLUS (SEPSIS)
1000.0000 mL | Freq: Once | INTRAVENOUS | Status: AC
  Administered 2016-09-06: 1000 mL via INTRAVENOUS

## 2016-09-06 NOTE — ED Provider Notes (Signed)
[facility] Provider Note   CSN: [id number] Arrival date & time: 09/06/16  1930     History   Chief Complaint Chief Complaint  Patient presents with  . Abdominal Pain    HPI Gabriela Benitez is a 36 y.o. female.  Patient is a 36 year old female with a history of recurrent epigastric pain who presents with upper abdominal pain. She's had multiple recent ED visits for pain across her upper abdomen. She recently seen by gastroenterology and had an upper endoscopy 2 days ago which did not reveal any evidence of peptic ulcer disease or other concerning findings. She states earlier today she started having worsening pain across her upper abdomen with associated nausea and vomiting. Her pain intensifies whenever she tries to eat anything. She denies any hematemesis. She's having normal bowel movements. No known fevers. No blood in her stool. No urinary symptoms. She's taking Nexium and Carafate at home without improvement in symptoms. She was seen in the ED recently given prescription for hydrocodone for pain but she currently does not have any opioid pain medication. She is status post cholecystectomy.      Past Medical History:  Diagnosis Date  . Anxiety   . Back pain   . Carpal tunnel syndrome on both sides   . Chronic back pain   . Chronic neck pain   . Depression   . Elevated LFTs    acute pyleonephritis  . Esophageal spasm    uses aloe vera with success  . GERD (gastroesophageal reflux disease)    Zantac  . Migraine     There are no active problems to display for this patient.   Past Surgical History:  Procedure Laterality Date  . CESAREAN SECTION  2007  . CESAREAN SECTION  01/27/2012   Procedure: CESAREAN SECTION;  Surgeon: Delbert Harnessaniel H Moore, MD;  Location: [facility];  Service: Gynecology;  Laterality: N/A;  . CHOLECYSTECTOMY    . DILATION AND CURETTAGE OF UTERUS    . NOSE SURGERY    . TONSILLECTOMY      OB History    Gravida Para Term Preterm AB Living     3 2 2   1 2    SAB TAB Ectopic Multiple Live Births   1       2       Home Medications    Prior to Admission medications   Medication Sig Start Date End Date Taking? Authorizing Provider  AMBULATORY NON FORMULARY MEDICATION Medication Name: Truvision Multivitamin-1 tablet daily    Historical Provider, MD  clonazePAM (KLONOPIN) 0.5 MG tablet TK 1 T PO BID PRN 07/08/16   Historical Provider, MD  clonazePAM (KLONOPIN) 1 MG tablet TK 1 T PO BID PRN 08/12/16   Historical Provider, MD  diazepam (VALIUM) 5 MG tablet TK 1 T BID AND 1-2 TS AT NIGHT PRF ANXIETY 07/08/16   Historical Provider, MD  EPINEPHrine 0.3 mg/0.3 mL IJ SOAJ injection Inject 0.3 mLs (0.3 mg total) into the muscle once. 12/26/15   Doug SouSam Jacubowitz, MD  esomeprazole (NEXIUM) 40 MG capsule Take 1 capsule (40 mg total) by mouth 2 (two) times daily before a meal. 08/21/16   Beverley FiedlerJay M Pyrtle, MD  HYDROcodone-acetaminophen (NORCO/VICODIN) 5-325 MG tablet Take 2 tablets by mouth every 4 (four) hours as needed. 08/31/16   Rolland PorterMark James, MD  hydrOXYzine (ATARAX/VISTARIL) 50 MG tablet TK 1 T PO TID PRF SEVERE ANXIETY 08/12/16   Historical Provider, MD  Na Sulfate-K Sulfate-Mg Sulf 17.5-3.13-1.6 GM/180ML SOLN Suprep-Use as  directed Patient not taking: Reported on 09/03/2016 08/21/16   Beverley Fiedler, MD  ondansetron (ZOFRAN ODT) 4 MG disintegrating tablet Take 1 tablet (4 mg total) by mouth every 8 (eight) hours as needed for nausea. Patient not taking: Reported on 09/03/2016 08/31/16   Rolland Porter, MD  QUEtiapine (SEROQUEL) 25 MG tablet TK 1 T PO ATN FOR 3 NTS THEN TK 2 TS PO ATN 08/12/16   Historical Provider, MD  ranitidine (ZANTAC) 150 MG tablet Take 150 mg by mouth 2 (two) times daily.    Historical Provider, MD  sucralfate (CARAFATE) 1 GM/10ML suspension Take 10 mLs (1 g total) by mouth 4 (four) times daily -  with meals and at bedtime. 08/31/16   Rolland Porter, MD    Family History Family History  Problem Relation Age of Onset  . Stomach cancer Father   .  Colon cancer Paternal Grandmother   . Colon polyps Neg Hx   . Rectal cancer Neg Hx     Social History Social History  Substance Use Topics  . Smoking status: Former Smoker    Packs/day: 0.25    Years: 11.00    Types: Cigarettes, E-cigarettes  . Smokeless tobacco: Never Used  . Alcohol use No     Allergies   Latex; Miralax [polyethylene glycol]; Codeine; and Eggs or egg-derived products   Review of Systems Review of Systems  Constitutional: Negative for chills, diaphoresis, fatigue and fever.  HENT: Negative for congestion, rhinorrhea and sneezing.   Eyes: Negative.   Respiratory: Negative for cough, chest tightness and shortness of breath.   Cardiovascular: Negative for chest pain and leg swelling.  Gastrointestinal: Positive for abdominal pain, nausea and vomiting. Negative for blood in stool and diarrhea.  Genitourinary: Negative for difficulty urinating, flank pain, frequency and hematuria.  Musculoskeletal: Negative for arthralgias and back pain.  Skin: Negative for rash.  Neurological: Negative for dizziness, speech difficulty, weakness, numbness and headaches.     Physical Exam Updated Vital Signs BP 119/75 (BP Location: Left Arm)   Pulse (!) 51   Temp 98.2 F (36.8 C) (Oral)   Resp (!) 28   Ht 5\' 4"  (1.626 m)   Wt 190 lb (86.2 kg)   SpO2 100%   BMI 32.61 kg/m   Physical Exam  Constitutional: She is oriented to person, place, and time. She appears well-developed and well-nourished.  HENT:  Head: Normocephalic and atraumatic.  Eyes: Pupils are equal, round, and reactive to light.  Neck: Normal range of motion. Neck supple.  Cardiovascular: Normal rate, regular rhythm and normal heart sounds.   Pulmonary/Chest: Effort normal and breath sounds normal. No respiratory distress. She has no wheezes. She has no rales. She exhibits no tenderness.  Abdominal: Soft. Bowel sounds are normal. There is tenderness (Tenderness across the upper abdomen). There is no  rebound and no guarding.  Musculoskeletal: Normal range of motion. She exhibits no edema.  Lymphadenopathy:    She has no cervical adenopathy.  Neurological: She is alert and oriented to person, place, and time.  Skin: Skin is warm and dry. No rash noted.  Psychiatric: She has a normal mood and affect.     ED Treatments / Results  Labs (all labs ordered are listed, but only abnormal results are displayed) Labs Reviewed  URINALYSIS, ROUTINE W REFLEX MICROSCOPIC - Abnormal; Notable for the following:       Result Value   Ketones, ur 40 (*)    All other components within normal limits  COMPREHENSIVE  METABOLIC PANEL - Abnormal; Notable for the following:    Potassium 3.2 (*)    CO2 20 (*)    All other components within normal limits  PREGNANCY, URINE  LIPASE, BLOOD  CBC    EKG  EKG Interpretation None       Radiology No results found.  Procedures Procedures (including critical care time)  Medications Ordered in ED Medications  sodium chloride 0.9 % bolus 1,000 mL (0 mLs Intravenous Stopped 09/06/16 2219)  ondansetron (ZOFRAN) injection 4 mg (4 mg Intravenous Given 09/06/16 2054)  pantoprazole (PROTONIX) injection 40 mg (40 mg Intravenous Given 09/06/16 2053)  morphine 4 MG/ML injection 4 mg (4 mg Intravenous Given 09/06/16 2053)  gi cocktail (Maalox,Lidocaine,Donnatal) (30 mLs Oral Given 09/06/16 2134)  fentaNYL (SUBLIMAZE) injection 50 mcg (50 mcg Intravenous Given 09/06/16 2258)     Initial Impression / Assessment and Plan / ED Course  I have reviewed the triage vital signs and the nursing notes.  Pertinent labs & imaging results that were available during my care of the patient were reviewed by me and considered in my medical decision making (see chart for details).     Patient with a history of recurrent epigastric pain presents with upper abdominal pain. Similar to her prior symptoms. She's been seen by gastroenterology and has had a recent upper endoscopy that  was negative. Her symptoms of been controlled in the ED. She has no ongoing vomiting. No fevers. Her pain is to her epigastric area. There is no evidence of pancreatitis. Her other blood work is reassuring. She was discharged home in good condition. She will follow-up with her PCP or a gastroenterologist.  Final Clinical Impressions(s) / ED Diagnoses   Final diagnoses:  Pain of upper abdomen    New Prescriptions New Prescriptions   No medications on file     Rolan Bucco, MD 09/06/16 2320

## 2016-09-06 NOTE — ED Notes (Signed)
Pt requesting something else for pain. Dr. Fredderick PhenixBelfi advised. Pt also attempting to use [facility] to obtain urine specimen.

## 2016-09-06 NOTE — ED Triage Notes (Signed)
Pt c/o LUQ pain x 6-7 wks, became severe 1 hr ago

## 2016-09-06 NOTE — ED Notes (Signed)
Pt states that she is ready to go home and will be back tomorrow if she does not have any imaging done of her abd during this ED visit. MD made aware.

## 2016-09-10 ENCOUNTER — Encounter: Payer: Self-pay | Admitting: Internal Medicine

## 2016-09-11 ENCOUNTER — Emergency Department (HOSPITAL_COMMUNITY): Payer: Medicaid Other

## 2016-09-11 ENCOUNTER — Emergency Department (HOSPITAL_COMMUNITY)
Admission: EM | Admit: 2016-09-11 | Discharge: 2016-09-11 | Disposition: A | Discharge status: Home / Self Care | Payer: Medicaid Other | Attending: Emergency Medicine | Admitting: Emergency Medicine

## 2016-09-11 ENCOUNTER — Encounter: Payer: Self-pay | Admitting: Internal Medicine

## 2016-09-11 ENCOUNTER — Ambulatory Visit: Payer: Medicaid Other | Admitting: Internal Medicine

## 2016-09-11 ENCOUNTER — Encounter (HOSPITAL_COMMUNITY): Payer: Self-pay | Admitting: Emergency Medicine

## 2016-09-11 VITALS — BP 134/77 | HR 63 | Temp 98.0°F | Resp 10 | Ht 64.0 in | Wt 190.0 lb

## 2016-09-11 DIAGNOSIS — R1084 Generalized abdominal pain: Secondary | ICD-10-CM | POA: Insufficient documentation

## 2016-09-11 DIAGNOSIS — Z87891 Personal history of nicotine dependence: Secondary | ICD-10-CM | POA: Insufficient documentation

## 2016-09-11 DIAGNOSIS — R1012 Left upper quadrant pain: Secondary | ICD-10-CM | POA: Diagnosis present

## 2016-09-11 DIAGNOSIS — Z91040 Latex allergy status: Secondary | ICD-10-CM | POA: Diagnosis not present

## 2016-09-11 DIAGNOSIS — Z79899 Other long term (current) drug therapy: Secondary | ICD-10-CM | POA: Diagnosis not present

## 2016-09-11 LAB — CBC
HCT: 36.7 % (ref 36.0–46.0)
Hemoglobin: 12.7 g/dL (ref 12.0–15.0)
MCH: 30.4 pg (ref 26.0–34.0)
MCHC: 34.6 g/dL (ref 30.0–36.0)
MCV: 87.8 fL (ref 78.0–100.0)
PLATELETS: 311 10*3/uL (ref 150–400)
RBC: 4.18 MIL/uL (ref 3.87–5.11)
RDW: 13.3 % (ref 11.5–15.5)
WBC: 6 10*3/uL (ref 4.0–10.5)

## 2016-09-11 LAB — COMPREHENSIVE METABOLIC PANEL
ALT: 41 U/L (ref 14–54)
AST: 49 U/L — ABNORMAL HIGH (ref 15–41)
Albumin: 4 g/dL (ref 3.5–5.0)
Alkaline Phosphatase: 68 U/L (ref 38–126)
Anion gap: 5 (ref 5–15)
BUN: 6 mg/dL (ref 6–20)
CHLORIDE: 112 mmol/L — AB (ref 101–111)
CO2: 26 mmol/L (ref 22–32)
Calcium: 9.1 mg/dL (ref 8.9–10.3)
Creatinine, Ser: 0.54 mg/dL (ref 0.44–1.00)
Glucose, Bld: 102 mg/dL — ABNORMAL HIGH (ref 65–99)
POTASSIUM: 3.9 mmol/L (ref 3.5–5.1)
Sodium: 143 mmol/L (ref 135–145)
Total Bilirubin: 0.6 mg/dL (ref 0.3–1.2)
Total Protein: 6.8 g/dL (ref 6.5–8.1)

## 2016-09-11 LAB — I-STAT BETA HCG BLOOD, ED (MC, WL, AP ONLY)

## 2016-09-11 LAB — LIPASE, BLOOD: LIPASE: 34 U/L (ref 11–51)

## 2016-09-11 MED ORDER — FENTANYL CITRATE (PF) 100 MCG/2ML IJ SOLN
50.0000 ug | INTRAMUSCULAR | Status: DC | PRN
  Administered 2016-09-11: 50 ug via INTRAVENOUS
  Filled 2016-09-11: qty 2

## 2016-09-11 MED ORDER — IOPAMIDOL (ISOVUE-300) INJECTION 61%
INTRAVENOUS | Status: AC
  Administered 2016-09-11: 100 mL via INTRAVENOUS
  Filled 2016-09-11: qty 100

## 2016-09-11 MED ORDER — SODIUM CHLORIDE 0.9 % IV SOLN: 500.0000 mL | INTRAVENOUS | Status: AC

## 2016-09-11 MED ORDER — HALOPERIDOL LACTATE 5 MG/ML IJ SOLN
2.0000 mg | Freq: Once | INTRAMUSCULAR | Status: AC
  Administered 2016-09-11: 2 mg via INTRAVENOUS
  Filled 2016-09-11: qty 1

## 2016-09-11 MED ORDER — DIPHENHYDRAMINE HCL 50 MG/ML IJ SOLN
50.0000 mg | Freq: Once | INTRAMUSCULAR | Status: AC
  Administered 2016-09-11: 50 mg via INTRAVENOUS
  Filled 2016-09-11: qty 1

## 2016-09-11 NOTE — Progress Notes (Signed)
Pt with considerable abd pain, LUQ and epigastric on presentation to the [facility] This has been eval'ed by negative EGD Recent ED visit unremarkable Colonoscopy planned to exclude colonic source. Only remaining test would be CT abd/pelvis Has hx of similar pain in the past with negative colonoscopy a number of years ago

## 2016-09-11 NOTE — ED Notes (Signed)
PA at bedside.

## 2016-09-11 NOTE — Progress Notes (Signed)
[facility]- pt had received 50 mcg of Fentanyl IV-see flow sheet.  Upon arrival to [facility], pt c/o abd pain as a "10."  At discharge pain was a "2."  [facility] Dr. Rhea BeltonPyrtle.  Report given to paramedics.  IV intact at DC, NS infusing. Pt's mother and daughter with her at DC

## 2016-09-11 NOTE — ED Provider Notes (Signed)
Pt seen and examined by me in ED as well. Pt with left upper abdominal pain and flank pain for several weeks. Pt has had several visits to ED for the same and has been followed by gastroenterologist. Had negative endoscopy. Today was in GI office for colonoscopy which was not performed due to pt having pain of 10/10, and she was sent here for further evaluation. Pt was given fentanyl for pain and her pain is better at this time. On exam, left cva tenderness, left upper quadrant tenderness, no guarding. Pt in NAD. Smiling. We will get repeat labs and ct abd/pelvis for further evaluation.    Patient CT scan is unremarkable. It does show diarrhea, patient just had GI prep for colonoscopy today which is most likely the cause of this finding. Patient has not had any diarrhea prior to this. On reexamination, patient does not appear to be in any pain. She received several doses of fentanyl and states he has not touched her pain. We will try Haldol and Benadryl after discussing patient with Dr. Erma HeritageIsaacs. Most likely functional abdominal pain in setting of negative CT, unremarkable blood work. I also spoke with Dr. Elnoria HowardHung who is on-call for Dr. Rhea BeltonPyrtle, who agreed that patient does not need to be admitted, specifically by their service. He did not think that they would do colonoscopy while in the hospital. In the setting of no nausea or vomiting, pain for over 3 weeks, I do not think patient will need to be admitted to the hospital today. She will follow-up outpatient.  8:00 PM Spoke with Dr. Elnoria HowardHung again who called back. Pt apparently called him from the hospital bed requesting admission. Dr. Elnoria HowardHung asked to admit pt for [facility] and they will perform colonoscopy in AM.   8:32 PM Spoke with Dr. Antionette Charpyd with [facility], who does not believe pt requires admission. Will page Dr. Elnoria HowardHung.   Spoke with Dr. Elnoria HowardHung. Pt cannot be bowel prepped in hospital given miralax allergy. Only reason for admission would be colonoscopy. PT has been doing  well in ED in the last 4 hrs and had PRN pain medication order which she did not ask for. She does not appear to be in distress. She is drinking with no emesis. She refused haldol and benadryl. I believe she is stable for dc home with close outpatient follow up. She will call Dr. Rhea BeltonPyrtle on Monday.   Pt again called oncall GI doctor who is Dr. Elnoria HowardHung who she spoke with (or so she told us) who again explained that he cannot bowel prep her here and that there is no other reason for her admission. Plan to dc home.   Vitals:   09/11/16 1455 09/11/16 1941 09/11/16 2150  BP:  (!) 142/74 133/85  Pulse:  (!) 58 66  Resp:  18 17  Temp:   98.4 F (36.9 C)  TempSrc:   Oral  SpO2: 96% 100% 100%      Gabriela Crumbleatyana Reyan Helle, PA-C 09/11/16 2221    Shaune Pollackameron Isaacs, MD 09/12/16 1314

## 2016-09-11 NOTE — ED Notes (Addendum)
Pt. Awaiting for her GI Doctor to call her back prior to discharge, pt. Stated that she was told that she will be admitted for colonoscopy in a.m. . To give meds once pt. ready .

## 2016-09-11 NOTE — ED Triage Notes (Signed)
Per EMS pt from GI office for a scheduled endo, unable to complete due to pain. Sent to Ed for evaluation. Was seen multiple times for similar symptoms. Alert and oriented x 4.

## 2016-09-11 NOTE — Progress Notes (Signed)
Decision made pre-procedure to transfer patient to the ED for further evaluation and treatment. I do not think proceeding with colonoscopy at her current pain level is wise.  She reports 10/10 LUQ and left flank pain radiating to the epigastrium.   Recent neg EGD CT scan abd/pelvis with contrast and then probable admission for pain control I have alerted the ED staff on her pending arrival (I spoke to charge RN Patty) I will also alert hospital GI coverage that she may be admitted.  Okay for clear liquids. She is prepped for colonscopy currently, but procedure on hold pending further eval.

## 2016-09-11 NOTE — ED Notes (Signed)
Pt ambulated to restroom under her own power with stand by assistance only without any difficulty.

## 2016-09-11 NOTE — ED Provider Notes (Signed)
[facility]-EMERGENCY DEPT Provider Note   CSN: [id number] Arrival date & time: 09/11/16  1449     History   Chief Complaint Chief Complaint  Patient presents with  . Abdominal Pain    HPI Gabriela Benitez is a 36 y.o. female.    Presents today for evaluation of abdominal pain that she has had for approximately 3 weeks. Today patient was her treating his office for a scheduled colonoscopy which was canceled due to 10/10 pain. The pain is mostly in her left upper quadrant/flank however it does radiate to the right upper quadrant and left lower quadrant.  Pain is continuous made worse with moving and eating, that is better without eating and has not eaten solid food for the past 5 days. The pain is occasionally associated with nausea however patient states that she is not currently nauseous. She reports that she has had some diarrhea (before the prep) but has mostly been formed, soft stools.  She reports no travel or concern for ingestion contaminated food or water. She has been previously evaluated in the emergency room for this pain 3 times and has had a upper GI scope.  She reports that 2 days ago she  stopped taking all of her medications.  There is no associated SOB, chest pain or weakness.  She has a headache that she says she gets when she "does not eat".  She reports she is supposed to get colonoscopies every 5 years due to history of polyps but reports it has been 8 years since her last colonoscopy.  Around when the abdominal pain started she reports that she had increased stress in her life.  Patient reports that she has been taking diet pills for the past 5 months.       Past Medical History:  Diagnosis Date  . Anxiety   . Back pain   . Carpal tunnel syndrome on both sides   . Chronic back pain   . Chronic neck pain   . Depression   . Elevated LFTs    acute pyleonephritis  . Esophageal spasm    uses aloe vera with success  . GERD (gastroesophageal reflux disease)    Zantac    . Migraine     There are no active problems to display for this patient.   Past Surgical History:  Procedure Laterality Date  . CESAREAN SECTION  2007  . CESAREAN SECTION  01/27/2012   Procedure: CESAREAN SECTION;  Surgeon: Delbert Harness, MD;  Location: [facility];  Service: Gynecology;  Laterality: N/A;  . CHOLECYSTECTOMY    . DILATION AND CURETTAGE OF UTERUS    . NOSE SURGERY    . TONSILLECTOMY      OB History    Gravida Para Term Preterm AB Living   3 2 2   1 2    SAB TAB Ectopic Multiple Live Births   1       2       Home Medications    Prior to Admission medications   Medication Sig Start Date End Date Taking? Authorizing Provider  AMBULATORY NON FORMULARY MEDICATION Medication Name: Truvision Multivitamin-1 tablet daily    Historical Provider, MD  clonazePAM (KLONOPIN) 0.5 MG tablet TK 1 T PO BID PRN 07/08/16   Historical Provider, MD  clonazePAM (KLONOPIN) 1 MG tablet TK 1 T PO BID PRN 08/12/16   Historical Provider, MD  diazepam (VALIUM) 5 MG tablet TK 1 T BID AND 1-2 TS AT NIGHT PRF ANXIETY 07/08/16  Historical Provider, MD  EPINEPHrine 0.3 mg/0.3 mL IJ SOAJ injection Inject 0.3 mLs (0.3 mg total) into the muscle once. 12/26/15   Doug SouSam Jacubowitz, MD  esomeprazole (NEXIUM) 40 MG capsule Take 1 capsule (40 mg total) by mouth 2 (two) times daily before a meal. 08/21/16   Beverley FiedlerJay M Pyrtle, MD  HYDROcodone-acetaminophen (NORCO/VICODIN) 5-325 MG tablet Take 2 tablets by mouth every 4 (four) hours as needed. Patient not taking: Reported on 09/11/2016 08/31/16   Rolland PorterMark James, MD  hydrOXYzine (ATARAX/VISTARIL) 50 MG tablet TK 1 T PO TID PRF SEVERE ANXIETY 08/12/16   Historical Provider, MD  ondansetron (ZOFRAN ODT) 4 MG disintegrating tablet Take 1 tablet (4 mg total) by mouth every 8 (eight) hours as needed for nausea. 08/31/16   Rolland PorterMark James, MD  QUEtiapine (SEROQUEL) 25 MG tablet TK 1 T PO ATN FOR 3 NTS THEN TK 2 TS PO ATN 08/12/16   Historical Provider, MD  ranitidine (ZANTAC) 150 MG tablet  Take 150 mg by mouth 2 (two) times daily.    Historical Provider, MD  sucralfate (CARAFATE) 1 GM/10ML suspension Take 10 mLs (1 g total) by mouth 4 (four) times daily -  with meals and at bedtime. 08/31/16   Rolland PorterMark James, MD    Family History Family History  Problem Relation Age of Onset  . Stomach cancer Father   . Colon cancer Paternal Grandmother   . Colon polyps Neg Hx   . Rectal cancer Neg Hx     Social History Social History  Substance Use Topics  . Smoking status: Former Smoker    Packs/day: 0.25    Years: 11.00    Types: Cigarettes, E-cigarettes  . Smokeless tobacco: Never Used  . Alcohol use No     Allergies   Morphine and related; Latex; Miralax [polyethylene glycol]; Codeine; and Eggs or egg-derived products   Review of Systems Review of Systems  Constitutional: Positive for fatigue. Negative for chills and fever.  HENT: Negative for ear pain and sore throat.   Eyes: Negative for pain and visual disturbance.  Respiratory: Negative for cough and shortness of breath.   Cardiovascular: Negative for chest pain and palpitations.  Gastrointestinal: Positive for abdominal pain, diarrhea and nausea. Negative for abdominal distention, anal bleeding, blood in stool, constipation and vomiting.  Genitourinary: Positive for flank pain. Negative for difficulty urinating, dysuria, frequency, hematuria and vaginal bleeding.  Musculoskeletal: Negative for arthralgias and back pain.  Skin: Negative for color change and rash.  Allergic/Immunologic: Positive for environmental allergies.  Neurological: Positive for headaches. Negative for dizziness, seizures, syncope and light-headedness.  All other systems reviewed and are negative.    Physical Exam Updated Vital Signs BP 133/85 (BP Location: Left Arm)   Pulse 66   Temp 98.4 F (36.9 C) (Oral)   Resp 17   SpO2 100%   Physical Exam  Constitutional: She appears well-developed and well-nourished. No distress.  HENT:  Head:  Normocephalic and atraumatic.  Eyes: Conjunctivae are normal. Pupils are unequal (Patient reports this is normal for her.).  Neck: Neck supple.  Cardiovascular: Normal rate and regular rhythm.   No murmur heard. Pulmonary/Chest: Effort normal and breath sounds normal. No respiratory distress.  Abdominal: Soft. Normal appearance and bowel sounds are normal. She exhibits no distension and no mass. There is no hepatosplenomegaly. There is tenderness in the right upper quadrant, left upper quadrant and left lower quadrant. There is guarding. There is no rigidity, no rebound and no tenderness at McBurney's point. No hernia.  Musculoskeletal: She exhibits no deformity.  Neurological: She is alert.  Skin: Skin is warm and dry. No rash noted.  Psychiatric: She has a normal mood and affect. Her behavior is normal.  Nursing note and vitals reviewed.    ED Treatments / Results  Labs (all labs ordered are listed, but only abnormal results are displayed) Labs Reviewed  COMPREHENSIVE METABOLIC PANEL - Abnormal; Notable for the following:       Result Value   Chloride 112 (*)    Glucose, Bld 102 (*)    AST 49 (*)    All other components within normal limits  LIPASE, BLOOD  CBC  I-STAT BETA HCG BLOOD, ED ([facility], [facility], [facility] ONLY)    EKG  EKG Interpretation None       Radiology Ct Abdomen Pelvis W Contrast  Result Date: 09/11/2016 CLINICAL DATA:  36 year old female with abdominal pain. Patient is scheduled for endoscopy. EXAM: CT ABDOMEN AND PELVIS WITH CONTRAST TECHNIQUE: Multidetector CT imaging of the abdomen and pelvis was performed using the standard protocol following bolus administration of intravenous contrast. CONTRAST:  1 ISOVUE-300 IOPAMIDOL (ISOVUE-300) INJECTION 61% COMPARISON:  Radiograph dated 08/31/2016 and CT dated 05/19/2015 FINDINGS: Lower chest: The visualized lung bases are clear.  The cyst No intra-abdominal free air. There is trace free fluid within the pelvis.  Hepatobiliary: Cholecystectomy.  The liver is unremarkable. Pancreas: Unremarkable. No pancreatic ductal dilatation or surrounding inflammatory changes. Spleen: Normal in size without focal abnormality. Adrenals/Urinary Tract: The adrenal glands are unremarkable. There is minimal fullness of the right renal pelvis and collecting system similar to prior CT, likely chronic. No frank hydronephrosis. The left kidney is unremarkable. The visualized ureters and urinary bladder appear unremarkable as well. Stomach/Bowel: Loose stool noted throughout the colon compatible with diarrheal state. Correlation with clinical exam and stool cultures recommended. There is no evidence of bowel obstruction or active inflammation. Normal appendix. Vascular/Lymphatic: The abdominal aorta and IVC appear unremarkable. No portal venous gas identified. There is no adenopathy. Reproductive: The uterus and the ovaries are grossly unremarkable as visualized. Other: None Musculoskeletal: No acute or significant osseous findings. IMPRESSION: 1. Diarrheal state. Correlation with clinical exam and stool cultures recommended. No evidence of bowel obstruction or active inflammation. Normal appendix. 2. Minimal right renal pelviectasis likely chronic. No hydronephrosis. 3. Status post prior cholecystectomy. Electronically Signed   By: Elgie Collard M.D.   On: 09/11/2016 18:03    Procedures Procedures (including critical care time)  Medications Ordered in ED Medications  fentaNYL (SUBLIMAZE) injection 50 mcg (50 mcg Intravenous Given 09/11/16 1800)  iopamidol (ISOVUE-300) 61 % injection (100 mLs Intravenous Contrast Given 09/11/16 1741)  haloperidol lactate (HALDOL) injection 2 mg (2 mg Intravenous Given 09/11/16 2055)  diphenhydrAMINE (BENADRYL) injection 50 mg (50 mg Intravenous Given 09/11/16 2057)     Initial Impression / Assessment and Plan / ED Course  I have reviewed the triage vital signs and the nursing notes, and notes from  Dr. Rhea Belton.  Pertinent labs & imaging results that were available during my care of the patient were reviewed by me and considered in my medical decision making (see chart for details).   MDM: Patient with multiple previous visits for similar symptom presented from Dr. Lauro Franklin office via EMS as she was in too much pain for the colonoscopy.  Patients pain was improved with fentanyl.  She has not had a CT scan yet and a CT was ordered, along with labs.  CT results discussed with patient.  1956: Informed by RN that patient is waiting for a call back from her GI and had refused the ordered haldol and benadryl.  Attempted to speak with patient but she would not stop talking on her phone.  2028: Spoke with patient, informed her we were waiting for a call back from medicine.  Patient asked for the haldol and benadryl  Patient is nontoxic, nonseptic appearing, in no apparent medical distress.  Patient's pain and other symptoms adequately managed in emergency department.  Fluid bolus given.  Labs, imaging and vitals reviewed.  Patient does not meet the SIRS or Sepsis criteria.  On repeat exam patient does not have a surgical abdomin and there are no peritoneal signs.  No indication of appendicitis, bowel obstruction, bowel perforation, cholecystitis, diverticulitis, PID or ectopic pregnancy.  Patient discharged home and given strict instructions for follow-up with their primary care physician and GI.  I have also discussed reasons to return immediately to the ER.    Both medicine and GI refused to admit patient.  Patient was upset that she would have to undergo the colonoscopy prep again and wanted someone to do her colonoscopy tonight so she would not have to repeat prep.  Patient personally called on call GI multiple times during her stay in the ED.  Patient received the Haldol and Benadryl and said that they made her feel "high" but didn't help with the pain.  At time of discharge patient is  hemodynamically stable, and upset about being discharged but accepting.    Please see note from Jaynie Crumble, PA-C for additional information.          Final Clinical Impressions(s) / ED Diagnoses   Final diagnoses:  Generalized abdominal pain    New Prescriptions Discharge Medication List as of 09/11/2016  9:23 PM       Cristina Gong, PA 09/11/16 2346    Azalia Bilis, MD 09/13/16 2133

## 2016-09-11 NOTE — Progress Notes (Deleted)
Previous note for procedure was entered in error on the  patient. SM

## 2016-09-11 NOTE — ED Notes (Signed)
[facility] Expected date: 09/11/16 Expected time: 2:57 PM Means of arrival:  Comments: abd pain

## 2016-09-11 NOTE — Progress Notes (Deleted)
Called to room to assist during endoscopic procedure.  Patient ID and intended procedure confirmed with present staff. Received instructions for my participation in the procedure from the performing physician.  

## 2016-09-11 NOTE — Progress Notes (Signed)
Pt co pain 10/10. Case cancelled per Dr. Rhea BeltonPyrtle.

## 2016-09-12 SURGERY — COLONOSCOPY WITH PROPOFOL
Anesthesia: Monitor Anesthesia Care

## 2016-09-14 ENCOUNTER — Telehealth: Payer: Self-pay | Admitting: Internal Medicine

## 2016-09-14 MED ORDER — NA SULFATE-K SULFATE-MG SULF 17.5-3.13-1.6 GM/177ML PO SOLN: ORAL | 0 refills | Status: DC

## 2016-09-14 NOTE — Telephone Encounter (Signed)
Rx sent 

## 2016-09-15 ENCOUNTER — Telehealth: Payer: Self-pay | Admitting: Internal Medicine

## 2016-09-15 NOTE — Telephone Encounter (Signed)
Pt scheduled for 09/17/16@4pm , pt has already picked up additional suprep. Prep instructions reviewed with pt over the phone and she verbalized understanding. Pt knows to arrive here at 3pm.

## 2016-09-15 NOTE — Telephone Encounter (Signed)
Yes, Thursday at 4 pm is okay for colonoscopy Please if sample SuPrep is available given she was previously prepped and the procedure was not performed due to ER evaluation and acute, severe pain This should complete her GI eval

## 2016-09-15 NOTE — Telephone Encounter (Signed)
Pt states the doc on call the other day when she went to the hospital was going to make her prep again prior to colon and the hospital did not have suprep only miralax that she is allergic to, pt did not get colon done in hospital. She has rescheduled for April but does not think she can wait that long. She thinks part of her pain was from some HCG spray she was taking to try and lose wt. She stopped taking it and was able to do some laundry and walk to the mailbox. She felt better so she ate some pizza yesterday but her stomach has been hurting since then. Pt reports she is also having diarrhea. There is a spot on the schedule in the [facility] 09/17/16@4pm , would this be an option for her? Please advise.

## 2016-09-17 ENCOUNTER — Encounter: Payer: Medicaid Other | Admitting: Internal Medicine

## 2016-09-17 ENCOUNTER — Ambulatory Visit (AMBULATORY_SURGERY_CENTER): Payer: Medicaid Other | Admitting: Internal Medicine

## 2016-09-17 ENCOUNTER — Encounter: Payer: Self-pay | Admitting: Internal Medicine

## 2016-09-17 VITALS — BP 111/63 | HR 59 | Temp 97.8°F | Resp 12 | Ht 64.0 in | Wt 190.0 lb

## 2016-09-17 DIAGNOSIS — R1012 Left upper quadrant pain: Secondary | ICD-10-CM | POA: Diagnosis not present

## 2016-09-17 MED ORDER — SODIUM CHLORIDE 0.9 % IV SOLN: 500.0000 mL | INTRAVENOUS | Status: AC

## 2016-09-17 NOTE — Progress Notes (Signed)
Pt's states no medical or surgical changes since previsit or office visit. 

## 2016-09-17 NOTE — Progress Notes (Signed)
A and O x3. Report to RN. Tolerated MAC anesthesia well.

## 2016-09-17 NOTE — Patient Instructions (Signed)

## 2016-09-17 NOTE — Op Note (Signed)
[facility] Patient Name: Gabriela Benitez Procedure Date: 09/17/2016 4:00 PM MRN: [id number] Endoscopist: Beverley Fiedler , MD Age: 36 Referring MD:  Date of Birth: [birth year] Gender: Female Account #: [id number] Procedure:                Colonoscopy Indications:              Abdominal pain in the left upper quadrant Medicines:                Monitored Anesthesia Care Procedure:                Pre-Anesthesia Assessment:                           - Prior to the procedure, a History and Physical                            was performed, and patient medications and                            allergies were reviewed. The patient's tolerance of                            previous anesthesia was also reviewed. The risks                            and benefits of the procedure and the sedation                            options and risks were discussed with the patient.                            All questions were answered, and informed consent                            was obtained. Prior Anticoagulants: The patient has                            taken no previous anticoagulant or antiplatelet                            agents. ASA Grade Assessment: II - A patient with                            mild systemic disease. After reviewing the risks                            and benefits, the patient was deemed in                            satisfactory condition to undergo the procedure.                           After obtaining informed consent, the colonoscope  was passed under direct vision. Throughout the                            procedure, the patient's blood pressure, pulse, and                            oxygen saturations were monitored continuously. The                            Model CF-HQ190L [id number]) scope was introduced                            through the anus and advanced to the the terminal                            ileum. The  colonoscopy was performed without                            difficulty. The patient tolerated the procedure                            well. The quality of the bowel preparation was                            excellent. The terminal ileum, ileocecal valve,                            appendiceal orifice, and rectum were photographed. Scope In: 4:42:58 PM Scope Out: 4:57:06 PM Scope Withdrawal Time: 0 hours 8 minutes 19 seconds  Total Procedure Duration: 0 hours 14 minutes 8 seconds  Findings:                 The perianal and digital rectal examinations were                            normal.                           The terminal ileum appeared normal.                           The entire examined colon appeared normal on direct                            and retroflexion views. Complications:            No immediate complications. Estimated Blood Loss:     Estimated blood loss: none. Impression:               - The examined portion of the ileum was normal.                           - The entire examined colon is normal on direct and                            retroflexion  views.                           - No specimens collected. Recommendation:           - Patient has a contact number available for                            emergencies. The signs and symptoms of potential                            delayed complications were discussed with the                            patient. Return to normal activities tomorrow.                            Written discharge instructions were provided to the                            patient.                           - Resume previous diet.                           - Continue present medications. Would resume Nexium                            40 mg daily given improved, but persistent left                            upper quadrant pain and gastritis seen on stomach                            biopsy. Avoid NSAIDs as these medications worsen GI                             upset. Return to primary care.                           - Repeat colonoscopy at age 36 for screening                            purposes. Beverley FiedlerJay M Ryin Ambrosius, MD 09/17/2016 5:03:48 PM This report has been signed electronically.

## 2016-09-18 ENCOUNTER — Telehealth: Payer: Self-pay | Admitting: *Deleted

## 2016-09-18 NOTE — Telephone Encounter (Signed)
No message left on f/u call due to permission to leave message not given yesterday when asked in admitting

## 2016-09-18 NOTE — Telephone Encounter (Signed)
  Follow up Call-  Call back number 09/17/2016 09/11/2016 09/03/2016  Post procedure Call Back phone  # [phone] mother- pt [phone]  Permission to leave phone message No No Yes  Some recent data might be hidden     Patient questions:  Do you have a fever, pain , or abdominal swelling? No. Pain Score  0 *  Have you tolerated food without any problems? Yes.    Have you been able to return to your normal activities? Yes.    Do you have any questions about your discharge instructions: Diet   No. Medications  No. Follow up visit  Yes.    Do you have questions or concerns about your Care? No.  Actions: * If pain score is 4 or above: No action needed, pain <4.

## 2016-09-20 ENCOUNTER — Encounter (HOSPITAL_BASED_OUTPATIENT_CLINIC_OR_DEPARTMENT_OTHER): Payer: Self-pay | Admitting: Emergency Medicine

## 2016-09-20 ENCOUNTER — Emergency Department (HOSPITAL_BASED_OUTPATIENT_CLINIC_OR_DEPARTMENT_OTHER)
Admission: EM | Admit: 2016-09-20 | Discharge: 2016-09-20 | Disposition: A | Discharge status: Home / Self Care | Payer: Medicaid Other | Attending: Emergency Medicine | Admitting: Emergency Medicine

## 2016-09-20 ENCOUNTER — Emergency Department (HOSPITAL_BASED_OUTPATIENT_CLINIC_OR_DEPARTMENT_OTHER): Payer: Medicaid Other

## 2016-09-20 DIAGNOSIS — R1012 Left upper quadrant pain: Secondary | ICD-10-CM

## 2016-09-20 DIAGNOSIS — K5900 Constipation, unspecified: Secondary | ICD-10-CM | POA: Insufficient documentation

## 2016-09-20 DIAGNOSIS — Z79899 Other long term (current) drug therapy: Secondary | ICD-10-CM | POA: Diagnosis not present

## 2016-09-20 DIAGNOSIS — R1013 Epigastric pain: Secondary | ICD-10-CM | POA: Insufficient documentation

## 2016-09-20 DIAGNOSIS — Z87891 Personal history of nicotine dependence: Secondary | ICD-10-CM | POA: Diagnosis not present

## 2016-09-20 LAB — COMPREHENSIVE METABOLIC PANEL
ALK PHOS: 82 U/L (ref 38–126)
ALT: 43 U/L (ref 14–54)
AST: 63 U/L — AB (ref 15–41)
Albumin: 4.2 g/dL (ref 3.5–5.0)
Anion gap: 8 (ref 5–15)
BUN: 8 mg/dL (ref 6–20)
CHLORIDE: 113 mmol/L — AB (ref 101–111)
CO2: 20 mmol/L — AB (ref 22–32)
CREATININE: 0.65 mg/dL (ref 0.44–1.00)
Calcium: 9.1 mg/dL (ref 8.9–10.3)
GFR calc Af Amer: >60 mL/min (ref >60)
GFR calc non Af Amer: >60 mL/min (ref >60)
Glucose, Bld: 115 mg/dL — ABNORMAL HIGH (ref 65–99)
Potassium: 3.8 mmol/L (ref 3.5–5.1)
SODIUM: 141 mmol/L (ref 135–145)
Total Bilirubin: 0.5 mg/dL (ref 0.3–1.2)
Total Protein: 7 g/dL (ref 6.5–8.1)

## 2016-09-20 LAB — URINALYSIS, ROUTINE W REFLEX MICROSCOPIC
Bilirubin Urine: NEGATIVE
GLUCOSE, UA: NEGATIVE mg/dL
HGB URINE DIPSTICK: NEGATIVE
Ketones, ur: NEGATIVE mg/dL
Leukocytes, UA: NEGATIVE
Nitrite: NEGATIVE
Protein, ur: NEGATIVE mg/dL
SPECIFIC GRAVITY, URINE: 1.017 (ref 1.005–1.030)
pH: 6 (ref 5.0–8.0)

## 2016-09-20 LAB — LIPASE, BLOOD: Lipase: 62 U/L — ABNORMAL HIGH (ref 11–51)

## 2016-09-20 LAB — CBC WITH DIFFERENTIAL/PLATELET
Basophils Absolute: 0.1 10*3/uL (ref 0.0–0.1)
Basophils Relative: 1 %
Eosinophils Absolute: 0.4 10*3/uL (ref 0.0–0.7)
Eosinophils Relative: 7 %
HCT: 37.3 % (ref 36.0–46.0)
Hemoglobin: 13.2 g/dL (ref 12.0–15.0)
LYMPHS ABS: 1.8 10*3/uL (ref 0.7–4.0)
Lymphocytes Relative: 35 %
MCH: 30.5 pg (ref 26.0–34.0)
MCHC: 35.4 g/dL (ref 30.0–36.0)
MCV: 86.1 fL (ref 78.0–100.0)
MONO ABS: 0.4 10*3/uL (ref 0.1–1.0)
MONOS PCT: 8 %
Neutro Abs: 2.6 10*3/uL (ref 1.7–7.7)
Neutrophils Relative %: 49 %
PLATELETS: 426 10*3/uL — AB (ref 150–400)
RBC: 4.33 MIL/uL (ref 3.87–5.11)
RDW: 12.5 % (ref 11.5–15.5)
WBC: 5.3 10*3/uL (ref 4.0–10.5)

## 2016-09-20 LAB — PREGNANCY, URINE: Preg Test, Ur: NEGATIVE

## 2016-09-20 MED ORDER — KETOROLAC TROMETHAMINE 30 MG/ML IJ SOLN
30.0000 mg | Freq: Once | INTRAMUSCULAR | Status: AC
  Administered 2016-09-20: 30 mg via INTRAVENOUS
  Filled 2016-09-20: qty 1

## 2016-09-20 MED ORDER — ONDANSETRON HCL 4 MG/2ML IJ SOLN
4.0000 mg | Freq: Once | INTRAMUSCULAR | Status: AC
Start: 2016-09-20 — End: 2016-09-20
  Administered 2016-09-20: 4 mg via INTRAVENOUS
  Filled 2016-09-20: qty 2

## 2016-09-20 MED ORDER — FENTANYL CITRATE (PF) 100 MCG/2ML IJ SOLN
50.0000 ug | Freq: Once | INTRAMUSCULAR | Status: AC
  Administered 2016-09-20: 50 ug via INTRAVENOUS
  Filled 2016-09-20: qty 2

## 2016-09-20 MED ORDER — SODIUM CHLORIDE 0.9 % IV BOLUS (SEPSIS)
1000.0000 mL | Freq: Once | INTRAVENOUS | Status: AC
  Administered 2016-09-20: 1000 mL via INTRAVENOUS

## 2016-09-20 NOTE — Discharge Instructions (Signed)
Follow-up with your gastroenterologist in the next 2-3 days.

## 2016-09-20 NOTE — ED Notes (Signed)
Pt ambulating to Radiology with technician.  Pt states she prefers to walk.  Pt denies any dizziness or weakness.  Steady gait, no SOB.

## 2016-09-20 NOTE — ED Triage Notes (Signed)
Multiple ED visits for left flank and LUQ pain. Has had GI work up but is being told has "inflammation" to stomach.

## 2016-09-20 NOTE — ED Provider Notes (Addendum)
[facility] Provider Note   CSN: [id number] Arrival date & time: 09/20/16  1558  By signing my name below, I, Teofilo PodMatthew P. Jamison, attest that this documentation has been prepared under the direction and in the presence of Geoffery Lyonsouglas Kawon Willcutt, MD . Electronically Signed: Teofilo PodMatthew P. Jamison, ED Scribe. 09/20/2016. 4:26 PM.    History   Chief Complaint Chief Complaint  Patient presents with  . Abdominal Pain    The history is provided by the patient. No language interpreter was used.  Abdominal Pain   This is a recurrent problem. The problem occurs constantly. The problem has been gradually worsening. The pain is located in the LUQ. Associated symptoms include constipation. Pertinent negatives include fever.   HPI Comments:  Gabriela Benitez is a 36 y.o. female who presents to the Emergency Department complaining of recurrent, worsening abdominal pain x 2 months. She states that the pain is primarily in the LUQ and radiates across her upper abdomen, and states that her abdomen is tender to touch. Pt had a colonoscopy 2 days ago and was diagnosed with chronic gastritis, and had her first solid food last night after a 1 week liquid diet. Pt ate gluten free spaghetti last night and the pain has worsened since. Pt complains of associated constipation. Pt has been seen here 5 times in 2 months for the same. She reports surgical hx of cholecystectomy. No alleviating factors noted. Pt denies fever, blood in stool.   Past Medical History:  Diagnosis Date  . Anxiety   . Back pain   . Carpal tunnel syndrome on both sides   . Chronic back pain   . Chronic neck pain   . Depression   . Elevated LFTs    acute pyleonephritis  . Esophageal spasm    uses aloe vera with success  . GERD (gastroesophageal reflux disease)    Zantac  . Migraine     There are no active problems to display for this patient.   Past Surgical History:  Procedure Laterality Date  . CESAREAN SECTION  2007  .  CESAREAN SECTION  01/27/2012   Procedure: CESAREAN SECTION;  Surgeon: Delbert Harnessaniel H Moore, MD;  Location: [facility];  Service: Gynecology;  Laterality: N/A;  . CHOLECYSTECTOMY    . DILATION AND CURETTAGE OF UTERUS    . NOSE SURGERY    . TONSILLECTOMY      OB History    Gravida Para Term Preterm AB Living   3 2 2   1 2    SAB TAB Ectopic Multiple Live Births   1       2       Home Medications    Prior to Admission medications   Medication Sig Start Date End Date Taking? Authorizing Provider  clonazePAM (KLONOPIN) 0.5 MG tablet TK 1 T PO BID PRN 07/08/16  Yes Historical Provider, MD  clonazePAM (KLONOPIN) 1 MG tablet TK 1 T PO BID PRN 08/12/16  Yes Historical Provider, MD  diazepam (VALIUM) 5 MG tablet TK 1 T BID AND 1-2 TS AT NIGHT PRF ANXIETY 07/08/16  Yes Historical Provider, MD  esomeprazole (NEXIUM) 40 MG capsule Take 1 capsule (40 mg total) by mouth 2 (two) times daily before a meal. 08/21/16  Yes Beverley FiedlerJay M Pyrtle, MD  hydrOXYzine (ATARAX/VISTARIL) 50 MG tablet TK 1 T PO TID PRF SEVERE ANXIETY 08/12/16  Yes Historical Provider, MD  ondansetron (ZOFRAN ODT) 4 MG disintegrating tablet Take 1 tablet (4 mg total) by mouth every 8 (eight)  hours as needed for nausea. 08/31/16  Yes Rolland Porter, MD  QUEtiapine (SEROQUEL) 25 MG tablet TK 1 T PO ATN FOR 3 NTS THEN TK 2 TS PO ATN 08/12/16  Yes Historical Provider, MD  sucralfate (CARAFATE) 1 GM/10ML suspension Take 10 mLs (1 g total) by mouth 4 (four) times daily -  with meals and at bedtime. 08/31/16  Yes Rolland Porter, MD  AMBULATORY NON FORMULARY MEDICATION Medication Name: Truvision Multivitamin-1 tablet daily    Historical Provider, MD  EPINEPHrine 0.3 mg/0.3 mL IJ SOAJ injection Inject 0.3 mLs (0.3 mg total) into the muscle once. 12/26/15   Doug Sou, MD  HYDROcodone-acetaminophen (NORCO/VICODIN) 5-325 MG tablet Take 2 tablets by mouth every 4 (four) hours as needed. Patient not taking: Reported on 09/11/2016 08/31/16   Rolland Porter, MD  ranitidine (ZANTAC) 150  MG tablet Take 150 mg by mouth 2 (two) times daily.    Historical Provider, MD    Family History Family History  Problem Relation Age of Onset  . Stomach cancer Father   . Colon cancer Paternal Grandmother   . Colon polyps Neg Hx   . Rectal cancer Neg Hx     Social History Social History  Substance Use Topics  . Smoking status: Former Smoker    Packs/day: 0.25    Years: 11.00    Types: Cigarettes, E-cigarettes  . Smokeless tobacco: Never Used  . Alcohol use No     Allergies   Morphine and related; Latex; Miralax [polyethylene glycol]; Codeine; and Eggs or egg-derived products   Review of Systems Review of Systems  Constitutional: Negative for fever.  Gastrointestinal: Positive for abdominal pain and constipation. Negative for blood in stool.  All other systems reviewed and are negative.    Physical Exam Updated Vital Signs BP 123/85 (BP Location: Right Arm)   Pulse 84   Temp 97.4 F (36.3 C) (Oral)   Resp (!) 23   Ht 5\' 4"  (1.626 m)   Wt 182 lb (82.6 kg)   SpO2 100%   BMI 31.24 kg/m   Physical Exam  Constitutional: She is oriented to person, place, and time. She appears well-developed and well-nourished. No distress.  Appears uncomfortable.   HENT:  Head: Normocephalic and atraumatic.  Mouth/Throat: Oropharynx is clear and moist. No oropharyngeal exudate.  Eyes: Conjunctivae and EOM are normal. Pupils are equal, round, and reactive to light.  Neck: Normal range of motion. Neck supple.  No meningismus.  Cardiovascular: Normal rate, regular rhythm, normal heart sounds and intact distal pulses.   No murmur heard. Pulmonary/Chest: Effort normal and breath sounds normal. No respiratory distress.  Abdominal: Soft. Bowel sounds are normal. She exhibits no distension. There is tenderness. There is no rebound and no guarding.  TTP in the epigastrium and LUQ.   Musculoskeletal: Normal range of motion. She exhibits no edema or tenderness.  Neurological: She is  alert and oriented to person, place, and time. No cranial nerve deficit. She exhibits normal muscle tone. Coordination normal.  No ataxia on finger to nose bilaterally. No pronator drift. 5/5 strength throughout. CN 2-12 intact.Equal grip strength. Sensation intact.   Skin: Skin is warm.  Psychiatric: She has a normal mood and affect. Her behavior is normal.  Nursing note and vitals reviewed.    ED Treatments / Results  DIAGNOSTIC STUDIES:  Oxygen Saturation is 100% on RA, normal by my interpretation.    COORDINATION OF CARE:  4:25 PM Discussed treatment plan with pt at bedside and pt agreed to  plan.   Labs (all labs ordered are listed, but only abnormal results are displayed) Labs Reviewed - No data to display  EKG  EKG Interpretation None       Radiology No results found.  Procedures Procedures (including critical care time)  Medications Ordered in ED Medications - No data to display   Initial Impression / Assessment and Plan / ED Course  I have reviewed the triage vital signs and the nursing notes.  Pertinent labs & imaging results that were available during my care of the patient were reviewed by me and considered in my medical decision making (see chart for details).  Laboratory studies are reassuring with the exception of a mildly elevated lipase, the significance of which I am uncertain, but doubt as the cause of her pain. X-rays reveal no evidence for obstruction or ileus. This patient's pain is chronic in nature. I will advise her to follow-up with her gastroenterologist in the near future to discuss where the workup is heading. To return as needed for any problems.  Final Clinical Impressions(s) / ED Diagnoses   Final diagnoses:  None    New Prescriptions New Prescriptions   No medications on file  I personally performed the services described in this documentation, which was scribed in my presence. The recorded information has been reviewed and is  accurate.        Geoffery Lyons, MD 09/20/16 1811    Geoffery Lyons, MD 09/20/16 [phone]

## 2016-10-07 ENCOUNTER — Encounter: Payer: Medicaid Other | Admitting: Internal Medicine

## 2016-10-14 ENCOUNTER — Encounter: Payer: Self-pay | Admitting: Physician Assistant

## 2016-10-14 ENCOUNTER — Other Ambulatory Visit (INDEPENDENT_AMBULATORY_CARE_PROVIDER_SITE_OTHER): Payer: Medicaid Other

## 2016-10-14 ENCOUNTER — Ambulatory Visit (INDEPENDENT_AMBULATORY_CARE_PROVIDER_SITE_OTHER): Payer: Medicaid Other | Admitting: Physician Assistant

## 2016-10-14 VITALS — BP 108/62 | Ht 64.0 in | Wt 181.0 lb

## 2016-10-14 DIAGNOSIS — R1084 Generalized abdominal pain: Secondary | ICD-10-CM | POA: Diagnosis not present

## 2016-10-14 DIAGNOSIS — R634 Abnormal weight loss: Secondary | ICD-10-CM

## 2016-10-14 DIAGNOSIS — R112 Nausea with vomiting, unspecified: Secondary | ICD-10-CM | POA: Diagnosis not present

## 2016-10-14 DIAGNOSIS — R109 Unspecified abdominal pain: Secondary | ICD-10-CM

## 2016-10-14 LAB — C-REACTIVE PROTEIN: CRP: 0.6 mg/dL (ref 0.5–20.0)

## 2016-10-14 LAB — SEDIMENTATION RATE: Sed Rate: 10 mm/hr (ref 0–20)

## 2016-10-14 NOTE — Patient Instructions (Signed)
Your physician has requested that you go to the basement for  lab work before leaving today.   

## 2016-10-14 NOTE — Progress Notes (Addendum)
Subjective:    Patient ID: Gabriela Benitez, female    DOB: 13-Nov-1980, 36 y.o.   MRN: [id number]  HPI Gabriela Benitez is a 36 year old white female known to Dr. Rhea Belton who has undergone recent workup for abdominal pain and weight loss. She was asked to come back to GI today by her primary care provider Zoe Lan NP for persistent symptoms and persistent weight loss. Patient says she has lost 25 pounds over the past 3 months. Prior to February she was feeling fine. She developed severe left-sided abdominal pain radiating down her left side and some pain up across the upper abdomen. She feels best if she doesn't eat and has generally not been eating during the day beset because she can't function if she eats. She is generally eating small portions between 6 and 9:00 in the evening and then taking sleeping medication and going to bed. Her appetite has been decreased., She has eliminated many foods from her diet that she thinks are exacerbating her symptoms including greasy foods acidic foods. She has tried supplements but says they also caused pain so as not using any sort of nutritional supplement. She says there is constant discomfort in her abdomen that if she eats the pain immediately goes up. She describes it as a burning shooting-type of pain. She had also been having diarrhea which she said she had the month of March. Now she is having "small squirts of diarrhea alternating with constipation. She was prescribed dicyclomine which she says increased her pain. She has had some nausea no vomiting. He describes pain in her lower abdomen bilaterally in the lower quadrants which is worse with bending. She has a poor energy level and says even with walking she gets tired in sort of sweaty at times. She also mentions that she's never lost weight this easily in the past. She had been on diet pills prior to onset of these symptoms. She says she "only has 24 pounds to go and then she will be in trouble. She is  resistant to the idea of trying any other supplements etc. She does have a psychiatric history with chronic anxiety and stress disorder. She is on several psychotropics including Clonopin, Valium, hydroxyzine, and Seroquel. She has been seeing a therapist once weekly and sees her psychiatrist every 3 months. She tells me that her therapist does not feel that her abdominal symptoms and weight loss or related to underlying stress and anxiety. Nevertheless her GI evaluation has been negative and labs have been unrevealing including celiac markers. She had CT of the abdomen and pelvis on 09/11/2016 which was normal, colonoscopy on 09/17/2016 normal and EGD on 09/03/2016 showed minimal changes of chronic gastritis on biopsy. Mucosa appeared normal. She is status post cholecystectomy.  Review of Systems Pertinent positive and negative review of systems were noted in the above HPI section.  All other review of systems was otherwise negative.  Outpatient Encounter Prescriptions as of 10/14/2016  Medication Sig  . AMBULATORY NON FORMULARY MEDICATION Medication Name: Truvision Multivitamin-1 tablet daily  . clonazePAM (KLONOPIN) 1 MG tablet TK 1 T PO BID PRN  . diazepam (VALIUM) 5 MG tablet TK 1 T BID AND 1-2 TS AT NIGHT PRF ANXIETY  . EPINEPHrine 0.3 mg/0.3 mL IJ SOAJ injection Inject 0.3 mLs (0.3 mg total) into the muscle once.  . hydrOXYzine (ATARAX/VISTARIL) 50 MG tablet TK 1 T PO TID PRF SEVERE ANXIETY  . ondansetron (ZOFRAN ODT) 4 MG disintegrating tablet Take 1 tablet (4 mg total) by  mouth every 8 (eight) hours as needed for nausea.  . Papaya 100 MG TABS Take 100 mg by mouth daily.  . Probiotic Product (PROBIOTIC DAILY PO) Take by mouth. 14 billion units one tablet twice a day  . QUEtiapine (SEROQUEL) 25 MG tablet TK 1 T PO ATN FOR 3 NTS THEN TK 2 TS PO ATN  . ranitidine (ZANTAC) 150 MG tablet Take 150 mg by mouth 2 (two) times daily.  . [DISCONTINUED] clonazePAM (KLONOPIN) 0.5 MG tablet TK 1 T PO BID  PRN  . [DISCONTINUED] esomeprazole (NEXIUM) 40 MG capsule Take 1 capsule (40 mg total) by mouth 2 (two) times daily before a meal.  . [DISCONTINUED] HYDROcodone-acetaminophen (NORCO/VICODIN) 5-325 MG tablet Take 2 tablets by mouth every 4 (four) hours as needed. (Patient not taking: Reported on 09/11/2016)  . [DISCONTINUED] sucralfate (CARAFATE) 1 GM/10ML suspension Take 10 mLs (1 g total) by mouth 4 (four) times daily -  with meals and at bedtime.   Facility-Administered Encounter Medications as of 10/14/2016  Medication  . 0.9 %  sodium chloride infusion  . 0.9 %  sodium chloride infusion  . 0.9 %  sodium chloride infusion   Allergies  Allergen Reactions  . Morphine And Related Other (See Comments)    Esophageal spasm  . Hydrocodone-Acetaminophen     Causes spasms  . Latex     Rash,  No severe allergy but gets rash for 24 hours, not breathing difficulties  . Miralax [Polyethylene Glycol] Itching  . Codeine Other (See Comments)    Migraines and esophageal spasm  . Eggs Or Egg-Derived Products Other (See Comments)    Migraine    There are no active problems to display for this patient.  Social History   Social History  . Marital status: Legally Separated    Spouse name: N/A  . Number of children: N/A  . Years of education: N/A   Occupational History  . Not on file.   Social History Main Topics  . Smoking status: Former Smoker    Packs/day: 0.25    Years: 11.00    Types: Cigarettes, E-cigarettes  . Smokeless tobacco: Never Used  . Alcohol use No  . Drug use: No  . Sexual activity: Yes    Birth control/ protection: Injection   Other Topics Concern  . Not on file   Social History Narrative  . No narrative on file    Ms. Pickerill's family history includes Colon cancer in her paternal grandmother; Stomach cancer in her father.      Objective:    Vitals:   10/14/16 1402  BP: 108/62    Physical Exam  well-developed young white female in no acute distress,  standing for most of the discussion,, height 5 foot 4, weight 181, BMI of 31.0. HEENT; nontraumatic normocephalic EOMI PERRLA, Cardiovascular; regular rate and rhythm with S1-S2, Pulmonary ;clear bilaterally, Abdomen ;soft nontender nondistended bowel sounds are active there is no palpable mass or hepatosplenomegaly, Rectal ;exam not done Ext; no clubbing cyanosis or edema skin warm and dry, Neuropsych; mood and affect appropriate      Assessment & Plan:   #57 36 year old white female with a 3 month illness with left-sided abdominal pain which has been ongoing and exacerbated with any by mouth intake. She initially claimed diarrhea for 1 month and now has had alternating small amounts of loose stools with constipation over the past month. She has had a 25 pound weight loss. Workup thus far has been unrevealing as to any  organic source for her symptoms. She has had negative CT scan, endoscopy and colonoscopy as well as labs. Last albumin was 4. I am not convinced that she has in underlying GI disorder accounting for pain and weight loss. I suspect that symptoms are functional and psychogenic. She does not seem to be upset by weight loss.  #2 history of chronic anxiety/stress disorder #3 status post cholecystectomy  Plan; we'll check sedimentation rate, CRP, pre-albumin, ANA, GI path panel and fecal elastase. I have discussed case with Dr. Rhea Belton, could consider capsule endoscopy though feel this would be low yield. If above studies are all negative and we'll be happy to send her for a second opinion, but she may be best served by further intervention by psychiatrist.  Sammuel Cooper PA-C 10/14/2016   Cc: Iona Hansen, NP   Addendum: Reviewed and agree with management. After thorough workup his pain is likely functional. Could consider second opinion at tertiary GI center versus return to primary care for management of chronic pain versus pain management clinic Beverley Fiedler, MD

## 2016-10-15 ENCOUNTER — Other Ambulatory Visit: Payer: Medicaid Other

## 2016-10-15 DIAGNOSIS — R634 Abnormal weight loss: Secondary | ICD-10-CM

## 2016-10-15 DIAGNOSIS — R109 Unspecified abdominal pain: Secondary | ICD-10-CM

## 2016-10-15 DIAGNOSIS — R112 Nausea with vomiting, unspecified: Secondary | ICD-10-CM

## 2016-10-15 DIAGNOSIS — R1084 Generalized abdominal pain: Secondary | ICD-10-CM

## 2016-10-15 LAB — PREALBUMIN: Prealbumin: 22 mg/dL (ref 17–34)

## 2016-10-15 LAB — ANA: Anti Nuclear Antibody(ANA): NEGATIVE

## 2016-10-16 LAB — FECAL LACTOFERRIN, QUANT: Lactoferrin: NEGATIVE

## 2016-10-19 ENCOUNTER — Telehealth: Payer: Self-pay | Admitting: Physician Assistant

## 2016-10-19 LAB — GASTROINTESTINAL PATHOGEN PANEL PCR
C. difficile Tox A/B, PCR: NOT DETECTED
Campylobacter, PCR: NOT DETECTED
Cryptosporidium, PCR: NOT DETECTED
E COLI (STEC) STX1/STX2, PCR: NOT DETECTED
E coli (ETEC) LT/ST PCR: NOT DETECTED
E coli 0157, PCR: NOT DETECTED
Giardia lamblia, PCR: NOT DETECTED
NOROVIRUS, PCR: NOT DETECTED
ROTAVIRUS, PCR: NOT DETECTED
SALMONELLA, PCR: NOT DETECTED
SHIGELLA, PCR: NOT DETECTED

## 2016-10-19 NOTE — Telephone Encounter (Signed)
Advised the results are not yet reviewed by the provider. I will call her tomorrow with the recommendations.

## 2016-10-20 ENCOUNTER — Telehealth: Payer: Self-pay

## 2016-10-20 NOTE — Telephone Encounter (Signed)
Would recommend referral to [facility] or [facility] gastroenterology for persistent abdominal pain and negative workup.

## 2016-10-20 NOTE — Telephone Encounter (Signed)
She can take Miralax one or two doses daily for constipation ,or we can give her a trail of Linzess 145 mcg daily for constipation.  Will also forward to Dr Rhea Belton  Regarding further testing ... ? Refer for second opinion  As extensive workup thus far negative

## 2016-10-20 NOTE — Telephone Encounter (Signed)
Patient is advised of her normal lab studies. She states she has continued with constipation and has not had a bowel movement since 10/15/16. She requests further testing.

## 2016-10-20 NOTE — Telephone Encounter (Signed)
-----   Message from Sammuel Cooper, PA-C sent at 10/19/2016  8:51 PM EDT ----- Stool studies all negative -please let pt know

## 2016-10-21 NOTE — Telephone Encounter (Signed)
No answer. No voicemail at [phone]

## 2016-10-21 NOTE — Telephone Encounter (Signed)
Linzess  145 mcg daily....and needs tertiary care for referral  So would refer to [facility]

## 2016-10-21 NOTE — Telephone Encounter (Signed)
Patient calls back. She states she is allergic to Miralax. She has had a bowel movement since talking to me last. Used MOM and root beer. She is interested in trying Linzess and will pick up samples from our office. She is interested in a referral to [facility] if it is [facility]. Otherwise she prefers to go to [facility].

## 2016-10-25 LAB — PANCREATIC ELASTASE, FECAL: Pancreatic Elastase-1, Stool: >500 mcg/g

## 2016-10-26 NOTE — Telephone Encounter (Signed)
Referral to [facility] [phone]. Information given. Records faxed to [phone]. Dx from last OV abdominal pain. N&V, weight loss. She will be scheduled in the [facility]. They contact the patient directly to schedule.  Patient is advised.

## 2016-10-28 ENCOUNTER — Telehealth: Payer: Self-pay | Admitting: Physician Assistant

## 2016-10-28 ENCOUNTER — Other Ambulatory Visit: Payer: Self-pay

## 2016-10-28 MED ORDER — LINACLOTIDE 145 MCG PO CAPS: 145.0000 ug | ORAL_CAPSULE | Freq: Every day | ORAL | 2 refills | Status: AC

## 2016-10-28 NOTE — Telephone Encounter (Signed)
I did forget. Rx has now been sent electronically.

## 2016-11-02 ENCOUNTER — Telehealth: Payer: Self-pay | Admitting: Physician Assistant

## 2016-11-03 NOTE — Telephone Encounter (Signed)
Yes..If these abnormal clinical findings persist, appropriate workup will be completed. The patient understands that follow up is required to elucidate the situation. She is on the dose it is ok if occasionally she takes a second dose - or we can increase dose to once daily

## 2016-11-03 NOTE — Telephone Encounter (Signed)
Has urges to have a bowel movement, but some days she cannot. She was hoping to take Linzess on a PRN basis and repeat the dose in the day. She cannot take Miralax due to an allergy. Doesn't take any stool softeners. [facility]  She will continue daily use of Linzess. May add Colace if she is having constipated stools. Increase activity and water intake.

## 2016-11-05 NOTE — Telephone Encounter (Signed)
Patient says her present dose of Linzess is working.

## 2016-12-03 ENCOUNTER — Emergency Department (HOSPITAL_BASED_OUTPATIENT_CLINIC_OR_DEPARTMENT_OTHER)
Admission: EM | Admit: 2016-12-03 | Discharge: 2016-12-04 | Disposition: A | Discharge status: Home / Self Care | Payer: Medicaid Other | Attending: Emergency Medicine | Admitting: Emergency Medicine

## 2016-12-03 ENCOUNTER — Encounter (HOSPITAL_BASED_OUTPATIENT_CLINIC_OR_DEPARTMENT_OTHER): Payer: Self-pay

## 2016-12-03 DIAGNOSIS — Z79899 Other long term (current) drug therapy: Secondary | ICD-10-CM | POA: Diagnosis not present

## 2016-12-03 DIAGNOSIS — G8929 Other chronic pain: Secondary | ICD-10-CM | POA: Diagnosis not present

## 2016-12-03 DIAGNOSIS — R109 Unspecified abdominal pain: Secondary | ICD-10-CM | POA: Diagnosis present

## 2016-12-03 DIAGNOSIS — Z87891 Personal history of nicotine dependence: Secondary | ICD-10-CM | POA: Diagnosis not present

## 2016-12-03 DIAGNOSIS — Z91040 Latex allergy status: Secondary | ICD-10-CM | POA: Diagnosis not present

## 2016-12-03 LAB — URINALYSIS, MICROSCOPIC (REFLEX): RBC / HPF: NONE SEEN RBC/hpf (ref 0–5)

## 2016-12-03 LAB — URINALYSIS, ROUTINE W REFLEX MICROSCOPIC
BILIRUBIN URINE: NEGATIVE
GLUCOSE, UA: NEGATIVE mg/dL
HGB URINE DIPSTICK: NEGATIVE
KETONES UR: 15 mg/dL — AB
Nitrite: NEGATIVE
PH: 5.5 (ref 5.0–8.0)
PROTEIN: NEGATIVE mg/dL
Specific Gravity, Urine: 1.009 (ref 1.005–1.030)

## 2016-12-03 LAB — PREGNANCY, URINE: Preg Test, Ur: NEGATIVE

## 2016-12-03 NOTE — ED Provider Notes (Signed)
[facility] Provider Note   CSN: [id number] Arrival date & time: 12/03/16  2147 By signing my name below, I, Gabriela Benitez, attest that this documentation has been prepared under the direction and in the presence of Jerelyn ScottLinker, Laurinda Carreno, MD . Electronically Signed: Levon HedgerElizabeth Benitez, Scribe. 12/04/2016. 12:09 AM.   History   Chief Complaint Chief Complaint  Patient presents with  . Abdominal Pain    HPI [name] is a 36 y.o. female with a history of GERD and esophogeal spasm  who presents to the Emergency Department complaining of gradually worsening, acute on chronic bilateral abdominal pain onset today. Pt states she has 4/10 abdominal pain at baseline, but feels as if her abdominal pain was exacerbated by riding in the car for prolonged periods today. No alleviating factors noted. She was started on a new medication 6 days ago for bile reduction which she has taken with no relief of pain. Pt reports associated nausea, headache, and right lateral flank pain. She has taken Zofran with no relief of nausea, and states she has been unable to eat today. She also expresses that she woke up today with tremors and was diaphoretic, but states this resolved after drinking several bottles of water. Pt has no other acute complaints or associated symptoms at this time.    The history is provided by the patient. No language interpreter was used.  Abdominal Pain   This is a chronic problem. The current episode started 6 to 12 hours ago. The problem occurs constantly. The problem has been gradually worsening. The pain is associated with an unknown factor. The pain is located in the generalized abdominal region. The pain is at a severity of 8/10. The pain is moderate. Associated symptoms include nausea and headaches. The symptoms are aggravated by certain positions and being still. Nothing relieves the symptoms. Past workup includes GI consult.    Past Medical History:  Diagnosis Date  . Anxiety     . Back pain   . Carpal tunnel syndrome on both sides   . Chronic back pain   . Chronic neck pain   . Depression   . Elevated LFTs    acute pyleonephritis  . Esophageal spasm    uses aloe vera with success  . GERD (gastroesophageal reflux disease)    Zantac  . Migraine     There are no active problems to display for this patient.   Past Surgical History:  Procedure Laterality Date  . CESAREAN SECTION  2007  . CESAREAN SECTION  01/27/2012   Procedure: CESAREAN SECTION;  Surgeon: Delbert Harnessaniel H Moore, MD;  Location: [facility];  Service: Gynecology;  Laterality: N/A;  . CHOLECYSTECTOMY    . DILATION AND CURETTAGE OF UTERUS    . NOSE SURGERY    . TONSILLECTOMY      OB History    Gravida Para Term Preterm AB Living   3 2 2   1 2    SAB TAB Ectopic Multiple Live Births   1       2       Home Medications    Prior to Admission medications   Medication Sig Start Date End Date Taking? Authorizing Provider  AMBULATORY NON FORMULARY MEDICATION Medication Name: Truvision Multivitamin-1 tablet daily    [provider]  clonazePAM (KLONOPIN) 1 MG tablet TK 1 T PO BID PRN 08/12/16   [provider]  diazepam (VALIUM) 5 MG tablet TK 1 T BID AND 1-2 TS AT NIGHT PRF ANXIETY 07/08/16  [provider]  EPINEPHrine 0.3 mg/0.3 mL IJ SOAJ injection Inject 0.3 mLs (0.3 mg total) into the muscle once. 12/26/15   Doug Sou, MD  hydrOXYzine (ATARAX/VISTARIL) 50 MG tablet TK 1 T PO TID PRF SEVERE ANXIETY 08/12/16   [provider]  linaclotide (LINZESS) 145 MCG CAPS capsule Take 1 capsule (145 mcg total) by mouth daily before breakfast. 10/28/16   Esterwood, Amy S, PA-C  ondansetron (ZOFRAN ODT) 4 MG disintegrating tablet Take 1 tablet (4 mg total) by mouth every 8 (eight) hours as needed for nausea. 08/31/16   Rolland Porter, MD  Papaya 100 MG TABS Take 100 mg by mouth daily.    [provider]  Probiotic Product (PROBIOTIC DAILY PO) Take by mouth. 14 billion units  one tablet twice a day    [provider]  QUEtiapine (SEROQUEL) 25 MG tablet TK 1 T PO ATN FOR 3 NTS THEN TK 2 TS PO ATN 08/12/16   [provider]  ranitidine (ZANTAC) 150 MG tablet Take 150 mg by mouth 2 (two) times daily.    [provider]    Family History Family History  Problem Relation Age of Onset  . Stomach cancer Father   . Colon cancer Paternal Grandmother   . Colon polyps Neg Hx   . Rectal cancer Neg Hx     Social History Social History  Substance Use Topics  . Smoking status: Former Smoker    Packs/day: 0.25    Years: 11.00    Types: Cigarettes, E-cigarettes  . Smokeless tobacco: Never Used  . Alcohol use No    Allergies   Morphine and related; Hydrocodone-acetaminophen; Latex; Miralax [polyethylene glycol]; Codeine; and Eggs or egg-derived products   Review of Systems Review of Systems  Constitutional: Positive for diaphoresis.  Gastrointestinal: Positive for abdominal pain and nausea.  Neurological: Positive for tremors and headaches.  All other systems reviewed and are negative.   Physical Exam Updated Vital Signs BP 111/71 (BP Location: Right Arm)   Pulse 74   Temp 97.9 F (36.6 C) (Oral)   Resp 18   Ht 5\' 4"  (1.626 m)   Wt 81.2 kg (179 lb)   SpO2 100%   BMI 30.73 kg/m  Vitals reviewed Physical Exam Physical Examination: General appearance - alert, well appearing, and in no distress Mental status - alert, oriented to person, place, and time Eyes - no conjunctival injection, no scleral icterus Chest - clear to auscultation, no wheezes, rales or rhonchi, symmetric air entry Heart - normal rate, regular rhythm, normal S1, S2, no murmurs, rubs, clicks or gallops Abdomen - soft, ttp in left mid and upper abdomen, no gaurding or rebound tenderness, nabs, , nondistended, no masses or organomegaly Neurological - alert, oriented, normal speech Extremities - peripheral pulses normal, no pedal edema, no clubbing or  cyanosis Skin - normal coloration and turgor, no rashes  ED Treatments / Results  DIAGNOSTIC STUDIES:  Oxygen Saturation is 100% on RA, normal by my interpretation.    COORDINATION OF CARE:  12:08 AM Discussed treatment plan with pt at bedside and pt agreed to plan.   Labs (all labs ordered are listed, but only abnormal results are displayed) Labs Reviewed  URINALYSIS, ROUTINE W REFLEX MICROSCOPIC - Abnormal; Notable for the following:       Result Value   APPearance CLOUDY (*)    Ketones, ur 15 (*)    Leukocytes, UA TRACE (*)    All other components within normal limits  URINALYSIS,  MICROSCOPIC (REFLEX) - Abnormal; Notable for the following:    Bacteria, UA RARE (*)    Squamous Epithelial / LPF 0-5 (*)    All other components within normal limits  COMPREHENSIVE METABOLIC PANEL - Abnormal; Notable for the following:    Sodium 132 (*)    CO2 16 (*)    AST 50 (*)    All other components within normal limits  PREGNANCY, URINE  CBC WITH DIFFERENTIAL/PLATELET  LIPASE, BLOOD    EKG  EKG Interpretation None       Radiology No results found.  Procedures Procedures (including critical care time)  Medications Ordered in ED Medications  fentaNYL (SUBLIMAZE) injection 50 mcg (50 mcg Intravenous Given 12/04/16 0036)  ondansetron (ZOFRAN) injection 4 mg (4 mg Intravenous Given 12/04/16 0036)  sodium chloride 0.9 % bolus 1,000 mL (0 mLs Intravenous Stopped 12/04/16 0100)     Initial Impression / Assessment and Plan / ED Course  I have reviewed the triage vital signs and the nursing notes.  Pertinent labs & imaging results that were available during my care of the patient were reviewed by me and considered in my medical decision making (see chart for details).     Pt with hx of chronic abdominal pain being followed by GI presenting with an exacerbation of her chronic left sided upper abdominal pain.  Labs are reassuring.  She is requesting a dose of fentanyl to get pain under  control. After one dose of meds, she is feeling better.  Pt discharged to continue to f/u with her GI and PMD on an outpatient basis.  Discharged with strict return precautions.  Pt agreeable with plan.  Final Clinical Impressions(s) / ED Diagnoses   Final diagnoses:  Chronic abdominal pain    New Prescriptions Discharge Medication List as of 12/04/2016 12:59 AM     I personally performed the services described in this documentation, which was scribed in my presence. The recorded information has been reviewed and is accurate.     Jerelyn Scott, MD 12/04/16 [phone]

## 2016-12-03 NOTE — ED Triage Notes (Addendum)
Pt c/o chronic bilateral abdominal pain, requested fentanyl from registration when she checked in.  Also c/o "severe nausea" despite taking zofran and being able to tolerate PO fluids.

## 2016-12-04 ENCOUNTER — Other Ambulatory Visit (HOSPITAL_BASED_OUTPATIENT_CLINIC_OR_DEPARTMENT_OTHER): Payer: Self-pay

## 2016-12-04 ENCOUNTER — Other Ambulatory Visit (HOSPITAL_BASED_OUTPATIENT_CLINIC_OR_DEPARTMENT_OTHER): Payer: Self-pay | Admitting: Cardiology

## 2016-12-04 ENCOUNTER — Other Ambulatory Visit: Payer: Self-pay | Admitting: Family Medicine

## 2016-12-04 DIAGNOSIS — N200 Calculus of kidney: Secondary | ICD-10-CM

## 2016-12-04 LAB — COMPREHENSIVE METABOLIC PANEL
ALBUMIN: 4.5 g/dL (ref 3.5–5.0)
ALK PHOS: 94 U/L (ref 38–126)
ALT: 48 U/L (ref 14–54)
AST: 50 U/L — ABNORMAL HIGH (ref 15–41)
Anion gap: 12 (ref 5–15)
BUN: 9 mg/dL (ref 6–20)
CHLORIDE: 104 mmol/L (ref 101–111)
CO2: 16 mmol/L — AB (ref 22–32)
Calcium: 9 mg/dL (ref 8.9–10.3)
Creatinine, Ser: 0.7 mg/dL (ref 0.44–1.00)
GFR calc non Af Amer: >60 mL/min (ref >60)
GLUCOSE: 91 mg/dL (ref 65–99)
Potassium: 3.6 mmol/L (ref 3.5–5.1)
SODIUM: 132 mmol/L — AB (ref 135–145)
Total Bilirubin: 0.6 mg/dL (ref 0.3–1.2)
Total Protein: 7.4 g/dL (ref 6.5–8.1)

## 2016-12-04 LAB — CBC WITH DIFFERENTIAL/PLATELET
BASOS PCT: 1 %
Basophils Absolute: 0.1 10*3/uL (ref 0.0–0.1)
EOS ABS: 0.1 10*3/uL (ref 0.0–0.7)
EOS PCT: 1 %
HCT: 36.8 % (ref 36.0–46.0)
HEMOGLOBIN: 12.9 g/dL (ref 12.0–15.0)
LYMPHS ABS: 1.8 10*3/uL (ref 0.7–4.0)
Lymphocytes Relative: 21 %
MCH: 30.6 pg (ref 26.0–34.0)
MCHC: 35.1 g/dL (ref 30.0–36.0)
MCV: 87.2 fL (ref 78.0–100.0)
Monocytes Absolute: 0.4 10*3/uL (ref 0.1–1.0)
Monocytes Relative: 5 %
NEUTROS PCT: 72 %
Neutro Abs: 6.1 10*3/uL (ref 1.7–7.7)
PLATELETS: 325 10*3/uL (ref 150–400)
RBC: 4.22 MIL/uL (ref 3.87–5.11)
RDW: 12.9 % (ref 11.5–15.5)
WBC: 8.4 10*3/uL (ref 4.0–10.5)

## 2016-12-04 LAB — LIPASE, BLOOD: Lipase: 23 U/L (ref 11–51)

## 2016-12-04 MED ORDER — FENTANYL CITRATE (PF) 100 MCG/2ML IJ SOLN
50.0000 ug | Freq: Once | INTRAMUSCULAR | Status: AC
  Administered 2016-12-04: 50 ug via INTRAVENOUS
  Filled 2016-12-04: qty 2

## 2016-12-04 MED ORDER — SODIUM CHLORIDE 0.9 % IV BOLUS (SEPSIS)
1000.0000 mL | Freq: Once | INTRAVENOUS | Status: AC
  Administered 2016-12-04: 1000 mL via INTRAVENOUS

## 2016-12-04 MED ORDER — ONDANSETRON HCL 4 MG/2ML IJ SOLN
4.0000 mg | Freq: Once | INTRAMUSCULAR | Status: AC
  Administered 2016-12-04: 4 mg via INTRAVENOUS
  Filled 2016-12-04: qty 2

## 2016-12-04 NOTE — ED Notes (Signed)
Family at bedside. 

## 2016-12-04 NOTE — Discharge Instructions (Signed)
Return to the ED with any concerns including vomiting and not able to keep down liquids or your medications, abdominal pain especially if it localizes to the right lower abdomen, fever or chills, and decreased urine output, decreased level of alertness or lethargy, or any other alarming symptoms.  °

## 2016-12-04 NOTE — ED Notes (Signed)
Instructed pt to have " fentanyl" she will have to have a ride present , pt states will call for ride

## 2016-12-07 ENCOUNTER — Telehealth: Payer: Self-pay | Admitting: *Deleted

## 2016-12-07 NOTE — Telephone Encounter (Signed)
[facility] [place] [facility]; patient is Non-established with [facility] [only sees [facility] regularly and PCP listed is only linked with [facility] via hospital/ED, forwarded to [place] for email/scan/SLS 06/11

## 2016-12-25 ENCOUNTER — Emergency Department (HOSPITAL_BASED_OUTPATIENT_CLINIC_OR_DEPARTMENT_OTHER): Payer: Medicaid Other

## 2016-12-25 ENCOUNTER — Emergency Department (HOSPITAL_BASED_OUTPATIENT_CLINIC_OR_DEPARTMENT_OTHER)
Admission: EM | Admit: 2016-12-25 | Discharge: 2016-12-26 | Disposition: A | Discharge status: Home / Self Care | Payer: Medicaid Other | Attending: Emergency Medicine | Admitting: Emergency Medicine

## 2016-12-25 ENCOUNTER — Encounter (HOSPITAL_BASED_OUTPATIENT_CLINIC_OR_DEPARTMENT_OTHER): Payer: Self-pay | Admitting: Emergency Medicine

## 2016-12-25 DIAGNOSIS — Z87891 Personal history of nicotine dependence: Secondary | ICD-10-CM | POA: Diagnosis not present

## 2016-12-25 DIAGNOSIS — Z79899 Other long term (current) drug therapy: Secondary | ICD-10-CM | POA: Insufficient documentation

## 2016-12-25 DIAGNOSIS — R0789 Other chest pain: Secondary | ICD-10-CM | POA: Insufficient documentation

## 2016-12-25 DIAGNOSIS — Z91040 Latex allergy status: Secondary | ICD-10-CM | POA: Insufficient documentation

## 2016-12-25 DIAGNOSIS — R079 Chest pain, unspecified: Secondary | ICD-10-CM | POA: Diagnosis present

## 2016-12-25 LAB — PREGNANCY, URINE: Preg Test, Ur: NEGATIVE

## 2016-12-25 LAB — TROPONIN I: Troponin I: <0.03 ng/mL (ref <0.03)

## 2016-12-25 NOTE — ED Notes (Signed)
Patient transported to X-ray 

## 2016-12-25 NOTE — ED Provider Notes (Signed)
[facility] Provider Note   CSN: [id number] Arrival date & time: 12/25/16  2200  By signing my name below, I, [name], attest that this documentation has been prepared under the direction and in the presence of non-physician practitioner, Russo, [place], PA-C. Electronically Signed: [name], ED Scribe. 12/25/16. 10:54 PM.  History   Chief Complaint Chief Complaint  Patient presents with  . Chest Pain   The history is provided by the patient. No language interpreter was used.   HPI Comments: Gabriela Benitez is a 36 y.o. female with a PMHx of GERD, esophageal spasm, chronic abd pain, who presents to the Emergency Department complaining of sudden onset, intermittent chest pain that began yesterday. Pt states she was doing karate before the episode and the pain started while she was at rest in the car. Pt describes pain as a pressure sensation on her sternum. Per pt, she had one episode yesterday and one tonight that has lasted 3 hours. No h/o PE/DVT, CA, recent long travel, recent surgery, recent trauma, prolonged immobilization, or exogenous estrogen usage. No current tobacco use but pt has a hx of 1 pack every 3 days for 12 years. Pt has a hx of cholecystectomy. Pt denies nausea, vomiting, diaphoresis, calf soreness, new abdominal pain or any other complaints at this time.  Past Medical History:  Diagnosis Date  . Anxiety   . Back pain   . Carpal tunnel syndrome on both sides   . Chronic back pain   . Chronic neck pain   . Depression   . Elevated LFTs    acute pyleonephritis  . Esophageal spasm    uses aloe vera with success  . GERD (gastroesophageal reflux disease)    Zantac  . Migraine     There are no active problems to display for this patient.   Past Surgical History:  Procedure Laterality Date  . CESAREAN SECTION  2007  . CESAREAN SECTION  01/27/2012   Procedure: CESAREAN SECTION;  Surgeon: Delbert Harnessaniel H Moore, MD;  Location: [facility];  Service:  Gynecology;  Laterality: N/A;  . CHOLECYSTECTOMY    . DILATION AND CURETTAGE OF UTERUS    . NOSE SURGERY    . TONSILLECTOMY      OB History    Gravida Para Term Preterm AB Living   3 2 2   1 2    SAB TAB Ectopic Multiple Live Births   1       2       Home Medications    Prior to Admission medications   Medication Sig Start Date End Date Taking? Authorizing Provider  gabapentin (NEURONTIN) 100 MG capsule Take 100 mg by mouth 3 (three) times daily.   Yes [provider]  AMBULATORY NON FORMULARY MEDICATION Medication Name: Truvision Multivitamin-1 tablet daily    [provider]  clonazePAM (KLONOPIN) 1 MG tablet TK 1 T PO BID PRN 08/12/16   [provider]  diazepam (VALIUM) 5 MG tablet TK 1 T BID AND 1-2 TS AT NIGHT PRF ANXIETY 07/08/16   [provider]  EPINEPHrine 0.3 mg/0.3 mL IJ SOAJ injection Inject 0.3 mLs (0.3 mg total) into the muscle once. 12/26/15   Doug SouJacubowitz, Sam, MD  hydrOXYzine (ATARAX/VISTARIL) 50 MG tablet TK 1 T PO TID PRF SEVERE ANXIETY 08/12/16   [provider]  linaclotide (LINZESS) 145 MCG CAPS capsule Take 1 capsule (145 mcg total) by mouth daily before breakfast. 10/28/16   Esterwood, Amy S, PA-C  ondansetron [facility](ZOFRAN  ODT) 4 MG disintegrating tablet Take 1 tablet (4 mg total) by mouth every 8 (eight) hours as needed for nausea. 08/31/16   Rolland Porter, MD  Papaya 100 MG TABS Take 100 mg by mouth daily.    [provider]  Probiotic Product (PROBIOTIC DAILY PO) Take by mouth. 14 billion units one tablet twice a day    [provider]  QUEtiapine (SEROQUEL) 25 MG tablet TK 1 T PO ATN FOR 3 NTS THEN TK 2 TS PO ATN 08/12/16   [provider]  ranitidine (ZANTAC) 150 MG tablet Take 150 mg by mouth 2 (two) times daily.    [provider]    Family History Family History  Problem Relation Age of Onset  . Stomach cancer Father   . Colon cancer Paternal Grandmother   . Colon polyps Neg Hx   .  Rectal cancer Neg Hx     Social History Social History  Substance Use Topics  . Smoking status: Former Smoker    Packs/day: 0.25    Years: 11.00    Types: Cigarettes, E-cigarettes  . Smokeless tobacco: Never Used  . Alcohol use No     Allergies   Morphine and related; Hydrocodone-acetaminophen; Latex; Miralax [polyethylene glycol]; Codeine; and Eggs or egg-derived products   Review of Systems Review of Systems  Constitutional: Negative for diaphoresis and fever.  HENT: Negative for sore throat and trouble swallowing.   Eyes: Negative for visual disturbance.  Respiratory: Positive for chest tightness.   Cardiovascular: Positive for chest pain. Negative for leg swelling.  Gastrointestinal: Negative for nausea and vomiting.  Genitourinary: Negative for dysuria and frequency.  Musculoskeletal: Negative for myalgias (no calf tenderness).  Neurological: Negative for syncope.  Psychiatric/Behavioral: The patient is nervous/anxious.      Physical Exam Updated Vital Signs BP 108/76 (BP Location: Right Arm)   Pulse 61   Resp 16   SpO2 100%   Physical Exam  Constitutional: She appears well-developed and well-nourished. She does not appear ill. No distress.  Resting comfortably in bed  HENT:  Head: Normocephalic and atraumatic.  Eyes: Conjunctivae are normal.  Neck: Normal range of motion. Neck supple.  Cardiovascular: Normal rate, regular rhythm, normal heart sounds and intact distal pulses.  Exam reveals no gallop and no friction rub.   No murmur heard. Pulmonary/Chest: Effort normal and breath sounds normal. No stridor. No respiratory distress. She has no decreased breath sounds. She has no wheezes. She has no rhonchi. She has no rales. She exhibits tenderness.  Chest pain is reproducible upon palpation of the sternum. Symmetric chest expansion.  Abdominal: Soft. Bowel sounds are normal. She exhibits no distension. There is no rebound.  Musculoskeletal: Normal range of  motion.  Neurological: She is alert.  Skin: Skin is warm.  Psychiatric: She has a normal mood and affect. Her behavior is normal.  Nursing note and vitals reviewed.    ED Treatments / Results  DIAGNOSTIC STUDIES: Oxygen Saturation is 100% on RA, normal by my interpretation.   COORDINATION OF CARE: 10:48 PM-Discussed next steps with pt including lab work and Chest XR. Pt verbalized understanding and is agreeable with the plan.   Labs (all labs ordered are listed, but only abnormal results are displayed) Labs Reviewed  TROPONIN I  PREGNANCY, URINE    EKG  EKG Interpretation  Date/Time:  Friday December 25 2016 22:05:48 EDT Ventricular Rate:  71 PR Interval:    QRS Duration: 127 QT Interval:  439 QTC Calculation: 478 R  Axis:   53 Text Interpretation:  Sinus rhythm Borderline short PR interval Nonspecific intraventricular conduction delay No significant change since last tracing Confirmed by Shaune Pollack [phone]) on 12/25/2016 11:24:51 PM       Radiology Dg Chest 2 View  Result Date: 12/25/2016 CLINICAL DATA:  Acute chest pain for 1 day. EXAM: CHEST  2 VIEW COMPARISON:  09/20/2016 and prior study FINDINGS: The cardiomediastinal silhouette is unremarkable. There is no evidence of focal airspace disease, pulmonary edema, suspicious pulmonary nodule/mass, pleural effusion, or pneumothorax. No acute bony abnormalities are identified. IMPRESSION: No active cardiopulmonary disease. Electronically Signed   By: Harmon Pier M.D.   On: 12/25/2016 23:57    Procedures Procedures (including critical care time)  Medications Ordered in ED Medications - No data to display   Initial Impression / Assessment and Plan / ED Course  I have reviewed the triage vital signs and the nursing notes.  Pertinent labs & imaging results that were available during my care of the patient were reviewed by me and considered in my medical decision making (see chart for details).      Pt w reproducible  chest wall pain. Chest pain is not likely of cardiac or pulmonary etiology d/t presentation, Low risk with Wells criteria for PE, VSS, no tracheal deviation, no JVD or new murmur, RRR, breath sounds equal bilaterally, EKG without acute abnormalities, negative troponin, and negative CXR. Pt has been advised to return to the ED if CP becomes exertional, associated with diaphoresis or nausea, radiates to left jaw/arm, worsens or becomes concerning in any way. Patient is to follow up with PCP in regards to today's hospital visit. Pt appears reliable for follow up and is agreeable to discharge.   Case has been discussed with Dr. Erma Heritage who agrees with the above plan to discharge.   Discussed results, findings, treatment and follow up. Patient advised of return precautions. Patient verbalized understanding and agreed with plan.  Final Clinical Impressions(s) / ED Diagnoses   Final diagnoses:  Chest wall pain    New Prescriptions New Prescriptions   No medications on file  I personally performed the services described in this documentation, which was scribed in my presence. The recorded information has been reviewed and is accurate.     Russo, [place] N, PA-C 12/26/16 1478    Shaune Pollack, MD 12/26/16 [phone]

## 2016-12-25 NOTE — ED Triage Notes (Signed)
States chest pain last night  "took a couple of sleeping pills" and was able to sleep.  Tonight pain started after episode of sob

## 2016-12-26 NOTE — Discharge Instructions (Signed)
Please read instructions below.  Follow up with your primary care provider. You can take tylenol as needed for pain. Return to the ER for new or worsening symptoms; including worsening chest pain, shortness of breath, pain that radiates to the arm or neck, pain or shortness of breath worsened with exertion.

## 2017-01-13 ENCOUNTER — Other Ambulatory Visit: Payer: Self-pay | Admitting: Physician Assistant

## 2017-01-14 NOTE — Telephone Encounter (Signed)
===  View-only below this line===  ----- Message ----- From: Beverley FiedlerPyrtle, Jay M, MD Sent: 01/14/2017   3:56 PM To: Derry SkillPam K Zacharias Ridling, CMA Subject: RE: [facility]ess [facility]      Tiffane Sheldon [facility] She needs to contact them for Linzess refills JMP  ----- Message ----- From: Derry SkillPeterman, Sanchez Hemmer K, CMA Sent: 01/14/2017  12:00 PM To: Beverley FiedlerJay M Pyrtle, MD Subject: Lin[facility]          Dr. Rhea BeltonPyrtle, In Amy''s abscence , patient is asking for Linzess 145 mcg refills.  However, [facility] . Patient did go. See Care Everywhere notes. I got the impression that she may have transferred care there.  Not sure. Dottie suggested I ask you .  Let me know what you think??  Thanks, Lowry RamPam Darcia Lampi CMA

## 2017-06-13 ENCOUNTER — Encounter (HOSPITAL_BASED_OUTPATIENT_CLINIC_OR_DEPARTMENT_OTHER): Payer: Self-pay | Admitting: *Deleted

## 2017-06-13 ENCOUNTER — Emergency Department (HOSPITAL_BASED_OUTPATIENT_CLINIC_OR_DEPARTMENT_OTHER)
Admission: EM | Admit: 2017-06-13 | Discharge: 2017-06-14 | Disposition: A | Discharge status: Home / Self Care | Payer: Medicaid Other | Attending: Emergency Medicine | Admitting: Emergency Medicine

## 2017-06-13 ENCOUNTER — Emergency Department (HOSPITAL_BASED_OUTPATIENT_CLINIC_OR_DEPARTMENT_OTHER): Payer: Medicaid Other

## 2017-06-13 ENCOUNTER — Other Ambulatory Visit: Payer: Self-pay

## 2017-06-13 DIAGNOSIS — Y9302 Activity, running: Secondary | ICD-10-CM | POA: Insufficient documentation

## 2017-06-13 DIAGNOSIS — S83102A Unspecified subluxation of left knee, initial encounter: Secondary | ICD-10-CM | POA: Diagnosis not present

## 2017-06-13 DIAGNOSIS — Z87891 Personal history of nicotine dependence: Secondary | ICD-10-CM | POA: Insufficient documentation

## 2017-06-13 DIAGNOSIS — Y998 Other external cause status: Secondary | ICD-10-CM | POA: Diagnosis not present

## 2017-06-13 DIAGNOSIS — X509XXA Other and unspecified overexertion or strenuous movements or postures, initial encounter: Secondary | ICD-10-CM | POA: Diagnosis not present

## 2017-06-13 DIAGNOSIS — Z79899 Other long term (current) drug therapy: Secondary | ICD-10-CM | POA: Insufficient documentation

## 2017-06-13 DIAGNOSIS — S8992XA Unspecified injury of left lower leg, initial encounter: Secondary | ICD-10-CM | POA: Diagnosis present

## 2017-06-13 DIAGNOSIS — Z91040 Latex allergy status: Secondary | ICD-10-CM | POA: Insufficient documentation

## 2017-06-13 DIAGNOSIS — Y929 Unspecified place or not applicable: Secondary | ICD-10-CM | POA: Diagnosis not present

## 2017-06-13 MED ORDER — OXYCODONE-ACETAMINOPHEN 5-325 MG PO TABS
1.0000 | ORAL_TABLET | Freq: Once | ORAL | Status: AC
  Administered 2017-06-14: 1 via ORAL
  Filled 2017-06-13: qty 1

## 2017-06-13 NOTE — ED Notes (Signed)
Alert, NAD, calm, interactive, resps e/u, speaking in clear complete sentences, no dyspnea noted, skin W&D, c/o L medial knee pain, and some numbness s/p fall injury with impact, xrays resulted/reviewed, ice pack applied, (denies: tingling, other pains or injuries, sob, nausea, dizziness or visual changes).

## 2017-06-13 NOTE — ED Triage Notes (Signed)
Pt states she [place] arcade to try catch her child and slipped and fell. Left knee "went backwards". Unable to bear weight

## 2017-06-14 ENCOUNTER — Emergency Department (HOSPITAL_BASED_OUTPATIENT_CLINIC_OR_DEPARTMENT_OTHER): Payer: Medicaid Other

## 2017-06-14 MED ORDER — OXYCODONE-ACETAMINOPHEN 5-325 MG PO TABS: 1.0000 | ORAL_TABLET | Freq: Four times a day (QID) | ORAL | 0 refills | Status: DC | PRN

## 2017-06-14 NOTE — ED Notes (Signed)
Knee immobilizer re-explained with rationale. Pt agreeable. Knee immobilizer placed. Ice packs re-applied. Pt to xray.

## 2017-06-14 NOTE — Discharge Instructions (Signed)
You were seen today and found to have a subluxation of the left knee.  Keep the knee immobilized and use crutches.  Follow-up with orthopedics as soon as possible.

## 2017-06-14 NOTE — ED Notes (Signed)
EDP at [facility] 

## 2017-06-14 NOTE — ED Notes (Signed)
[facility]

## 2017-06-14 NOTE — ED Provider Notes (Signed)
[facility] EMERGENCY DEPARTMENT Provider Note   CSN: [id number] Arrival date & time: 06/13/17  2127     History   Chief Complaint Chief Complaint  Patient presents with  . Knee Pain    HPI Gabriela Benitez is a 36 y.o. female.  HPI  This is a 36 year old female who presents with left knee pain.  Patient reports that she was running when she hyperextended her left knee.  Since that time she has had increasing swelling and pain.  She rates her pain at 10 out of 10.  She states that she has been unable to bear weight.  Denies any other injury including hitting her head or loss of consciousness.  Past Medical History:  Diagnosis Date  . Anxiety   . Back pain   . Carpal tunnel syndrome on both sides   . Chronic back pain   . Chronic neck pain   . Depression   . Elevated LFTs    acute pyleonephritis  . Esophageal spasm    uses aloe vera with success  . GERD (gastroesophageal reflux disease)    Zantac  . Migraine     There are no active problems to display for this patient.   Past Surgical History:  Procedure Laterality Date  . CESAREAN SECTION  2007  . CESAREAN SECTION  01/27/2012   Procedure: CESAREAN SECTION;  Surgeon: Delbert Harness, MD;  Location: [facility];  Service: Gynecology;  Laterality: N/A;  . CHOLECYSTECTOMY    . DILATION AND CURETTAGE OF UTERUS    . NOSE SURGERY    . TONSILLECTOMY      OB History    Gravida Para Term Preterm AB Living   3 2 2   1 2    SAB TAB Ectopic Multiple Live Births   1       2       Home Medications    Prior to Admission medications   Medication Sig Start Date End Date Taking? Authorizing Provider  AMBULATORY NON FORMULARY MEDICATION Medication Name: Truvision Multivitamin-1 tablet daily    [provider]  clonazePAM (KLONOPIN) 1 MG tablet TK 1 T PO BID PRN 08/12/16   [provider]  diazepam (VALIUM) 5 MG tablet TK 1 T BID AND 1-2 TS AT NIGHT PRF ANXIETY 07/08/16   [provider]    EPINEPHrine 0.3 mg/0.3 mL IJ SOAJ injection Inject 0.3 mLs (0.3 mg total) into the muscle once. 12/26/15   Doug Sou, MD  gabapentin (NEURONTIN) 100 MG capsule Take 100 mg by mouth 3 (three) times daily.    [provider]  hydrOXYzine (ATARAX/VISTARIL) 50 MG tablet TK 1 T PO TID PRF SEVERE ANXIETY 08/12/16   [provider]  linaclotide (LINZESS) 145 MCG CAPS capsule Take 1 capsule (145 mcg total) by mouth daily before breakfast. 10/28/16   Esterwood, Amy S, PA-C  ondansetron (ZOFRAN ODT) 4 MG disintegrating tablet Take 1 tablet (4 mg total) by mouth every 8 (eight) hours as needed for nausea. 08/31/16   Rolland Porter, MD  oxyCODONE-acetaminophen (PERCOCET/ROXICET) 5-325 MG tablet Take 1-2 tablets by mouth every 6 (six) hours as needed for severe pain. 06/14/17   Rucha Wissinger, Mayer Masker, MD  Papaya 100 MG TABS Take 100 mg by mouth daily.    [provider]  Probiotic Product (PROBIOTIC DAILY PO) Take by mouth. 14 billion units one tablet twice a day    [provider]  QUEtiapine (SEROQUEL) 25 MG tablet TK 1 T PO  ATN FOR 3 NTS THEN TK 2 TS PO ATN 08/12/16   [provider]  ranitidine (ZANTAC) 150 MG tablet Take 150 mg by mouth 2 (two) times daily.    [provider]    Family History Family History  Problem Relation Age of Onset  . Stomach cancer Father   . Colon cancer Paternal Grandmother   . Colon polyps Neg Hx   . Rectal cancer Neg Hx     Social History Social History   Tobacco Use  . Smoking status: Former Smoker    Packs/day: 0.25    Years: 11.00    Pack years: 2.75    Types: Cigarettes, E-cigarettes  . Smokeless tobacco: Never Used  Substance Use Topics  . Alcohol use: Yes    Comment: 1x day  . Drug use: No     Allergies   Morphine and related; Hydrocodone-acetaminophen; Latex; Miralax [polyethylene glycol]; Codeine; and Eggs or egg-derived products   Review of Systems Review of Systems  Constitutional: Negative for  fever.  Musculoskeletal:       Left knee pain  Neurological: Negative for weakness and numbness.  All other systems reviewed and are negative.    Physical Exam Updated Vital Signs BP (!) 114/95 (BP Location: Right Arm)   Pulse 86   Temp 98.2 F (36.8 C) (Oral)   Resp 18   Ht 5\' 4"  (1.626 m)   Wt 88.9 kg (196 lb)   SpO2 98%   BMI 33.64 kg/m   Physical Exam  Constitutional: She is oriented to person, place, and time. She appears well-developed and well-nourished.  Overweight, no acute distress  HENT:  Head: Normocephalic and atraumatic.  Cardiovascular: Normal rate and regular rhythm.  Pulmonary/Chest: Effort normal. No respiratory distress.  Musculoskeletal:  Left knee effusion noted, tenderness along the medial joint line, patient resistant to range of motion secondary to pain, patella slightly located medially, patient can fire her quad, vascular intact distally  Neurological: She is alert and oriented to person, place, and time.  Skin: Skin is warm and dry.  Psychiatric: She has a normal mood and affect.  Nursing note and vitals reviewed.    ED Treatments / Results  Labs (all labs ordered are listed, but only abnormal results are displayed) Labs Reviewed - No data to display  EKG  EKG Interpretation None       Radiology Dg Knee 2 Views Left  Result Date: 06/14/2017 CLINICAL DATA:  Post reduction EXAM: LEFT KNEE - 1-2 VIEW COMPARISON:  June 13, 2017 FINDINGS: Frontal and lateral views obtained. The previously noted medial patellar subluxation has been reduced. Currently no fracture or dislocation. No patellar subluxation evident. No joint effusion. Joint spaces appear normal. IMPRESSION: There is no longer apparent patellar subluxation. No fracture or dislocation. No joint effusion. No evident arthropathy. Electronically Signed   By: Bretta BangWilliam  Woodruff III M.D.   On: 06/14/2017 01:11   Dg Knee Complete 4 Views Left  Result Date: 06/13/2017 CLINICAL DATA:   Pain following fall EXAM: LEFT KNEE - COMPLETE 4+ VIEW COMPARISON:  None. FINDINGS: Frontal, lateral, and bilateral oblique views were obtained. There is no appreciable fracture. There is medial patellar subluxation without frank dislocation. No joint effusion. Joint spaces appear normal. No erosive change. IMPRESSION: Medial patellar subluxation. No fracture or frank dislocation. No joint effusion. No appreciable arthropathy. Electronically Signed   By: Bretta BangWilliam  Woodruff III M.D.   On: 06/13/2017 22:34    Procedures Reduction of dislocation Date/Time: 06/14/2017 1:43  AM Performed by: Shon BatonHorton, Portia Wisdom F, MD Authorized by: Shon BatonHorton, Jerin Franzel F, MD  Consent: Verbal consent obtained. Consent given by: patient Time out: Immediately prior to procedure a "time out" was called to verify the correct patient, procedure, equipment, support staff and site/side marked as required. Local anesthesia used: no  Anesthesia: Local anesthesia used: no  Sedation: Patient sedated: no  Patient tolerance: Patient tolerated the procedure well with no immediate complications Comments: Reduction of medial patellar subluxation    (including critical care time)  Medications Ordered in ED Medications  oxyCODONE-acetaminophen (PERCOCET/ROXICET) 5-325 MG per tablet 1 tablet (1 tablet Oral Given 06/14/17 0006)     Initial Impression / Assessment and Plan / ED Course  I have reviewed the triage vital signs and the nursing notes.  Pertinent labs & imaging results that were available during my care of the patient were reviewed by me and considered in my medical decision making (see chart for details).     Left knee injury after hyperextension.  X-rays show slight subluxation of the left patella.  Clinically this is evident on exam.  Patient is very resistant to range of motion exercises.  She was given pain medication.  Reduction was successful with some improved range of motion following reduction.  Patient was  placed in a knee immobilizer and plain films verify improved alignment.  Follow-up with orthopedics recommended.  Given acute injury, patient was given a short course of pain medication.  After history, exam, and medical workup I feel the patient has been appropriately medically screened and is safe for discharge home. Pertinent diagnoses were discussed with the patient. Patient was given return precautions.   Final Clinical Impressions(s) / ED Diagnoses   Final diagnoses:  Subluxation of left knee, initial encounter    ED Discharge Orders        Ordered    oxyCODONE-acetaminophen (PERCOCET/ROXICET) 5-325 MG tablet  Every 6 hours PRN     06/14/17 0119       Shon BatonHorton, Eleyna Brugh F, MD 06/14/17 [phone]

## 2017-06-14 NOTE — ED Notes (Signed)
Pt stated that she already has a knee immobilizer. RN Cletis AthensJon informed

## 2017-06-22 ENCOUNTER — Encounter (HOSPITAL_BASED_OUTPATIENT_CLINIC_OR_DEPARTMENT_OTHER): Payer: Self-pay | Admitting: Emergency Medicine

## 2017-06-22 ENCOUNTER — Emergency Department (HOSPITAL_BASED_OUTPATIENT_CLINIC_OR_DEPARTMENT_OTHER): Payer: Medicaid Other

## 2017-06-22 ENCOUNTER — Other Ambulatory Visit: Payer: Self-pay

## 2017-06-22 ENCOUNTER — Emergency Department (HOSPITAL_BASED_OUTPATIENT_CLINIC_OR_DEPARTMENT_OTHER)
Admission: EM | Admit: 2017-06-22 | Discharge: 2017-06-22 | Disposition: A | Discharge status: Home / Self Care | Payer: Medicaid Other | Attending: Emergency Medicine | Admitting: Emergency Medicine

## 2017-06-22 DIAGNOSIS — Z87891 Personal history of nicotine dependence: Secondary | ICD-10-CM | POA: Insufficient documentation

## 2017-06-22 DIAGNOSIS — Z91040 Latex allergy status: Secondary | ICD-10-CM | POA: Insufficient documentation

## 2017-06-22 DIAGNOSIS — Z79899 Other long term (current) drug therapy: Secondary | ICD-10-CM | POA: Insufficient documentation

## 2017-06-22 DIAGNOSIS — M25562 Pain in left knee: Secondary | ICD-10-CM

## 2017-06-22 MED ORDER — KETOROLAC TROMETHAMINE 60 MG/2ML IM SOLN
60.0000 mg | Freq: Once | INTRAMUSCULAR | Status: AC
Start: 2017-06-22 — End: 2017-06-22
  Administered 2017-06-22: 60 mg via INTRAMUSCULAR
  Filled 2017-06-22: qty 2

## 2017-06-22 MED ORDER — OXYCODONE HCL 5 MG PO TABS
10.0000 mg | ORAL_TABLET | Freq: Once | ORAL | Status: AC
  Administered 2017-06-22: 10 mg via ORAL
  Filled 2017-06-22: qty 2

## 2017-06-22 NOTE — Discharge Instructions (Signed)
Take Tylenol 1000 mg 4 times a day for 1 week. This is the maximum dose of Tylenol (acetaminophen) you can take from all sources. Please check other over-the-counter medications and prescriptions to ensure you are not taking other medications that contain acetaminophen.  You may also take ibuprofen 400 mg 6 times a day alternating with or at the same time as tylenol.  °

## 2017-06-22 NOTE — ED Triage Notes (Signed)
Pt reports right knee pain , had injury last week, patella displacement, brace on, sts her dog jumped on injured knee, severe pain, pt appears in distress. Reports took 800 mg ibuprofen at 730 Am today , no relief.

## 2017-06-22 NOTE — ED Provider Notes (Signed)
[facility] EMERGENCY DEPARTMENT Provider Note   CSN: [id number] Arrival date & time: 06/22/17  4098     History   Chief Complaint Chief Complaint  Patient presents with  . Knee Pain    right    HPI Gabriela Benitez is a 36 y.o. female.  HPI   36 year old female with a history of chronic neck pain, chronic back pain, anxiety, depression, recent evaluation in the emergency department on 1217 with concern for left patellar subluxation with reduction presents with concern for knee pain.  Patient reports that while she was taking the pain medications, her pain was under good control, and she probably "did too much".  Reports at times she was not using her crutches or support with ambulating on it.  She has been using her immobilizer.  Reports that her pain might have been controlled and she did too much, and at this time has no other pain medications left.  Reports that she had seen orthopedic doctor, who had offered her pain medications but since she had medications from the emergency department, and is feeling well, she did not accept this prescription.  Reports that her 65 pound dog jumped on her knee, and now she is having severe pain.  Reports she is unable to move it much given pain.  Past Medical History:  Diagnosis Date  . Anxiety   . Back pain   . Carpal tunnel syndrome on both sides   . Chronic back pain   . Chronic neck pain   . Depression   . Elevated LFTs    acute pyleonephritis  . Esophageal spasm    uses aloe vera with success  . GERD (gastroesophageal reflux disease)    Zantac  . Migraine     There are no active problems to display for this patient.   Past Surgical History:  Procedure Laterality Date  . CESAREAN SECTION  2007  . CESAREAN SECTION  01/27/2012   Procedure: CESAREAN SECTION;  Surgeon: Delbert Harness, MD;  Location: [facility];  Service: Gynecology;  Laterality: N/A;  . CHOLECYSTECTOMY    . DILATION AND CURETTAGE OF UTERUS    . NOSE  SURGERY    . TONSILLECTOMY      OB History    Gravida Para Term Preterm AB Living   3 2 2   1 2    SAB TAB Ectopic Multiple Live Births   1       2       Home Medications    Prior to Admission medications   Medication Sig Start Date End Date Taking? Authorizing Provider  AMBULATORY NON FORMULARY MEDICATION Medication Name: Truvision Multivitamin-1 tablet daily    [provider]  clonazePAM (KLONOPIN) 1 MG tablet TK 1 T PO BID PRN 08/12/16   [provider]  diazepam (VALIUM) 5 MG tablet TK 1 T BID AND 1-2 TS AT NIGHT PRF ANXIETY 07/08/16   [provider]  EPINEPHrine 0.3 mg/0.3 mL IJ SOAJ injection Inject 0.3 mLs (0.3 mg total) into the muscle once. 12/26/15   Doug Sou, MD  gabapentin (NEURONTIN) 100 MG capsule Take 100 mg by mouth 3 (three) times daily.    [provider]  hydrOXYzine (ATARAX/VISTARIL) 50 MG tablet TK 1 T PO TID PRF SEVERE ANXIETY 08/12/16   [provider]  linaclotide (LINZESS) 145 MCG CAPS capsule Take 1 capsule (145 mcg total) by mouth daily before breakfast. 10/28/16   Esterwood, Amy S, PA-C  ondansetron (ZOFRAN ODT)  4 MG disintegrating tablet Take 1 tablet (4 mg total) by mouth every 8 (eight) hours as needed for nausea. 08/31/16   Rolland PorterJames, Mark, MD  oxyCODONE-acetaminophen (PERCOCET/ROXICET) 5-325 MG tablet Take 1-2 tablets by mouth every 6 (six) hours as needed for severe pain. 06/14/17   Horton, Mayer Maskerourtney F, MD  Papaya 100 MG TABS Take 100 mg by mouth daily.    [provider]  Probiotic Product (PROBIOTIC DAILY PO) Take by mouth. 14 billion units one tablet twice a day    [provider]  QUEtiapine (SEROQUEL) 25 MG tablet TK 1 T PO ATN FOR 3 NTS THEN TK 2 TS PO ATN 08/12/16   [provider]  ranitidine (ZANTAC) 150 MG tablet Take 150 mg by mouth 2 (two) times daily.    [provider]    Family History Family History  Problem Relation Age of Onset  . Stomach cancer Father   .  Colon cancer Paternal Grandmother   . Colon polyps Neg Hx   . Rectal cancer Neg Hx     Social History Social History   Tobacco Use  . Smoking status: Former Smoker    Packs/day: 0.25    Years: 11.00    Pack years: 2.75    Types: Cigarettes, E-cigarettes  . Smokeless tobacco: Never Used  Substance Use Topics  . Alcohol use: Yes    Comment: 1x day  . Drug use: No     Allergies   Morphine and related; Hydrocodone-acetaminophen; Latex; Miralax [polyethylene glycol]; Codeine; and Eggs or egg-derived products   Review of Systems Review of Systems  Constitutional: Negative for fever.  Eyes: Negative for visual disturbance.  Musculoskeletal: Positive for arthralgias and joint swelling.  Skin: Negative for rash.  Neurological: Negative for syncope.     Physical Exam Updated Vital Signs BP 116/72   Pulse 89   Temp 97.6 F (36.4 C) (Oral)   Resp 20   Ht 5\' 4"  (1.626 m)   Wt 88.9 kg (196 lb)   SpO2 99%   BMI 33.64 kg/m   Physical Exam  Constitutional: She is oriented to person, place, and time. She appears well-developed and well-nourished. No distress.  HENT:  Head: Normocephalic and atraumatic.  Eyes: Conjunctivae and EOM are normal.  Neck: Normal range of motion.  Cardiovascular: Normal rate, regular rhythm and intact distal pulses.  Pulmonary/Chest: Effort normal. No respiratory distress.  Musculoskeletal: She exhibits no edema.       Left knee: She exhibits decreased range of motion and swelling. Tenderness found. Medial joint line and lateral joint line tenderness noted.  Neurological: She is alert and oriented to person, place, and time.  Skin: Skin is warm and dry. No rash noted. She is not diaphoretic. No erythema.  Nursing note and vitals reviewed.    ED Treatments / Results  Labs (all labs ordered are listed, but only abnormal results are displayed) Labs Reviewed - No data to display  EKG  EKG Interpretation None       Radiology Dg Knee  Complete 4 Views Left  Result Date: 06/23/2017 CLINICAL DATA:  LEFT knee pain, seen multiple times in the last week, recent fall, remote patellar dislocation 10 years ago EXAM: LEFT KNEE - COMPLETE 4+ VIEW COMPARISON:  06/22/2017 FINDINGS: Osseous mineralization normal. Joint spaces preserved. No fracture, dislocation, or bone destruction. No joint effusion. IMPRESSION: Normal exam, unchanged. Electronically Signed   By: Ulyses SouthwardMark  Boles M.D.   On: 06/23/2017 15:12   Dg Knee Complete  4 Views Left  Result Date: 06/22/2017 CLINICAL DATA:  Injury with patella dislocation 1 week ago. Re-injury today. EXAM: LEFT KNEE - COMPLETE 4+ VIEW COMPARISON:  06/14/2017 FINDINGS: No evidence of fracture, dislocation, or joint effusion. No evidence of arthropathy or other focal bone abnormality. Soft tissues are unremarkable. IMPRESSION: Negative. Electronically Signed   By: Elberta Fortisaniel  Boyle M.D.   On: 06/22/2017 10:51    Procedures Procedures (including critical care time)  Medications Ordered in ED Medications  oxyCODONE (Oxy IR/ROXICODONE) immediate release tablet 10 mg (10 mg Oral Given 06/22/17 1043)  ketorolac (TORADOL) injection 60 mg (60 mg Intramuscular Given 06/22/17 1044)     Initial Impression / Assessment and Plan / ED Course  I have reviewed the triage vital signs and the nursing notes.  Pertinent labs & imaging results that were available during my care of the patient were reviewed by me and considered in my medical decision making (see chart for details).    36 year old female with a history of chronic neck pain, chronic back pain, anxiety, depression, recent evaluation in the emergency department on 1217 with concern for left patellar subluxation with reduction presents with concern for knee pain. Given oxycodone and toradol in ED. XR obtained showing no sign of dislocation or other abnormalities. No sign of septic joint on exam.  Pain localized to knee, doubt DVT.  Pt declined naproxen because of  gastritis. Recommend full dose tylenol and orthopedic follow up. Patient discharged in stable condition with understanding of reasons to return.   Final Clinical Impressions(s) / ED Diagnoses   Final diagnoses:  Acute pain of left knee    ED Discharge Orders    None       Alvira MondaySchlossman, Justun Anaya, MD 06/23/17 2008

## 2017-06-23 ENCOUNTER — Emergency Department (HOSPITAL_COMMUNITY)
Admission: EM | Admit: 2017-06-23 | Discharge: 2017-06-23 | Disposition: A | Discharge status: Home / Self Care | Payer: Medicaid Other | Attending: Emergency Medicine | Admitting: Emergency Medicine

## 2017-06-23 ENCOUNTER — Emergency Department (HOSPITAL_COMMUNITY): Payer: Medicaid Other

## 2017-06-23 ENCOUNTER — Other Ambulatory Visit: Payer: Self-pay

## 2017-06-23 DIAGNOSIS — Z87891 Personal history of nicotine dependence: Secondary | ICD-10-CM | POA: Insufficient documentation

## 2017-06-23 DIAGNOSIS — F329 Major depressive disorder, single episode, unspecified: Secondary | ICD-10-CM | POA: Insufficient documentation

## 2017-06-23 DIAGNOSIS — Z9049 Acquired absence of other specified parts of digestive tract: Secondary | ICD-10-CM | POA: Diagnosis not present

## 2017-06-23 DIAGNOSIS — Z79899 Other long term (current) drug therapy: Secondary | ICD-10-CM | POA: Diagnosis not present

## 2017-06-23 DIAGNOSIS — Z91040 Latex allergy status: Secondary | ICD-10-CM | POA: Diagnosis not present

## 2017-06-23 DIAGNOSIS — M25562 Pain in left knee: Secondary | ICD-10-CM | POA: Diagnosis not present

## 2017-06-23 DIAGNOSIS — G8929 Other chronic pain: Secondary | ICD-10-CM

## 2017-06-23 MED ORDER — KETOROLAC TROMETHAMINE 30 MG/ML IJ SOLN
30.0000 mg | Freq: Once | INTRAMUSCULAR | Status: AC
  Administered 2017-06-23: 30 mg via INTRAMUSCULAR
  Filled 2017-06-23: qty 1

## 2017-06-23 NOTE — ED Provider Notes (Signed)
[facility] EMERGENCY DEPARTMENT Provider Note   CSN: [id number] Arrival date & time: 06/23/17  1316     History   Chief Complaint Chief Complaint  Patient presents with  . Knee Pain    HPI Hansini Clodfelter is a 36 y.o. female with a past medical history of chronic back pain, anxiety, depression, recent left patellar subluxation with reduction, who presents to ED for evaluation of continued left knee pain.  She was seen and evaluated at [facility] location yesterday morning after increase in pain with ambulation.  She is supposed to be using crutches but was ambulating without them.  She reports using her knee immobilizer as directed.  X-ray was done yesterday and was negative for acute abnormality.  She also states that a large dog jumped on her knee which caused her pain yesterday.  She did follow-up with her orthopedist this morning and was prescribed a stronger anti-inflammatory medication that she has not gotten filled yet.  She returns today because after the orthopedic appointment, she slipped and again hyperextended her knee.  She is again concerned that she did worsening damage to her knee.  She reports taking Tylenol and ibuprofen with no relief in her symptoms.  She denies any prior DVT, trouble breathing, chest pain, falling onto the knee, numbness in leg.  HPI  Past Medical History:  Diagnosis Date  . Anxiety   . Back pain   . Carpal tunnel syndrome on both sides   . Chronic back pain   . Chronic neck pain   . Depression   . Elevated LFTs    acute pyleonephritis  . Esophageal spasm    uses aloe vera with success  . GERD (gastroesophageal reflux disease)    Zantac  . Migraine     There are no active problems to display for this patient.   Past Surgical History:  Procedure Laterality Date  . CESAREAN SECTION  2007  . CESAREAN SECTION  01/27/2012   Procedure: CESAREAN SECTION;  Surgeon: Delbert Harness, MD;  Location: [facility];  Service:  Gynecology;  Laterality: N/A;  . CHOLECYSTECTOMY    . DILATION AND CURETTAGE OF UTERUS    . NOSE SURGERY    . TONSILLECTOMY      OB History    Gravida Para Term Preterm AB Living   3 2 2   1 2    SAB TAB Ectopic Multiple Live Births   1       2       Home Medications    Prior to Admission medications   Medication Sig Start Date End Date Taking? Authorizing Provider  AMBULATORY NON FORMULARY MEDICATION Medication Name: Truvision Multivitamin-1 tablet daily    [provider]  clonazePAM (KLONOPIN) 1 MG tablet TK 1 T PO BID PRN 08/12/16   [provider]  diazepam (VALIUM) 5 MG tablet TK 1 T BID AND 1-2 TS AT NIGHT PRF ANXIETY 07/08/16   [provider]  EPINEPHrine 0.3 mg/0.3 mL IJ SOAJ injection Inject 0.3 mLs (0.3 mg total) into the muscle once. 12/26/15   Doug Sou, MD  gabapentin (NEURONTIN) 100 MG capsule Take 100 mg by mouth 3 (three) times daily.    [provider]  hydrOXYzine (ATARAX/VISTARIL) 50 MG tablet TK 1 T PO TID PRF SEVERE ANXIETY 08/12/16   [provider]  linaclotide (LINZESS) 145 MCG CAPS capsule Take 1 capsule (145 mcg total) by mouth daily before breakfast. 10/28/16   Esterwood, Amy S,  PA-C  ondansetron (ZOFRAN ODT) 4 MG disintegrating tablet Take 1 tablet (4 mg total) by mouth every 8 (eight) hours as needed for nausea. 08/31/16   Rolland PorterJames, Mark, MD  oxyCODONE-acetaminophen (PERCOCET/ROXICET) 5-325 MG tablet Take 1-2 tablets by mouth every 6 (six) hours as needed for severe pain. 06/14/17   Horton, Mayer Maskerourtney F, MD  Papaya 100 MG TABS Take 100 mg by mouth daily.    [provider]  Probiotic Product (PROBIOTIC DAILY PO) Take by mouth. 14 billion units one tablet twice a day    [provider]  QUEtiapine (SEROQUEL) 25 MG tablet TK 1 T PO ATN FOR 3 NTS THEN TK 2 TS PO ATN 08/12/16   [provider]  ranitidine (ZANTAC) 150 MG tablet Take 150 mg by mouth 2 (two) times daily.    [provider]     Family History Family History  Problem Relation Age of Onset  . Stomach cancer Father   . Colon cancer Paternal Grandmother   . Colon polyps Neg Hx   . Rectal cancer Neg Hx     Social History Social History   Tobacco Use  . Smoking status: Former Smoker    Packs/day: 0.25    Years: 11.00    Pack years: 2.75    Types: Cigarettes, E-cigarettes  . Smokeless tobacco: Never Used  Substance Use Topics  . Alcohol use: Yes    Comment: 1x day  . Drug use: No     Allergies   Morphine and related; Hydrocodone-acetaminophen; Latex; Miralax [polyethylene glycol]; Codeine; and Eggs or egg-derived products   Review of Systems Review of Systems  Constitutional: Negative for chills and fever.  Gastrointestinal: Negative for nausea and vomiting.  Musculoskeletal: Positive for arthralgias, gait problem and joint swelling. Negative for back pain, myalgias, neck pain and neck stiffness.  Skin: Negative for color change and wound.  Neurological: Negative for weakness and numbness.     Physical Exam Updated Vital Signs BP (!) 155/99 (BP Location: Right Arm)   Pulse (!) 102   Temp (!) 97.5 F (36.4 C) (Oral)   Resp 20   SpO2 98%   Physical Exam  Constitutional: She appears well-developed and well-nourished. No distress.  Nontoxic appearing and in no acute distress.  Patient is tearful.  HENT:  Head: Normocephalic and atraumatic.  Eyes: Conjunctivae and EOM are normal. No scleral icterus.  Neck: Normal range of motion.  Pulmonary/Chest: Effort normal. No respiratory distress.  Musculoskeletal: She exhibits tenderness.  Tenderness to palpation of the medial and lateral left patella.  Some edema noted.  2+ DP pulse noted bilaterally.  Patient reports pain with range of motion.  Sensation intact to light touch of bilateral lower extremities.  Neurological: She is alert.  Skin: No rash noted. She is not diaphoretic.  Psychiatric: She has a normal mood and affect.  Nursing note  and vitals reviewed.    ED Treatments / Results  Labs (all labs ordered are listed, but only abnormal results are displayed) Labs Reviewed - No data to display  EKG  EKG Interpretation None       Radiology Dg Knee Complete 4 Views Left  Result Date: 06/23/2017 CLINICAL DATA:  LEFT knee pain, seen multiple times in the last week, recent fall, remote patellar dislocation 10 years ago EXAM: LEFT KNEE - COMPLETE 4+ VIEW COMPARISON:  06/22/2017 FINDINGS: Osseous mineralization normal. Joint spaces preserved. No fracture, dislocation, or bone destruction. No joint effusion. IMPRESSION: Normal exam, unchanged. Electronically Signed  By: Ulyses SouthwardMark  Boles M.D.   On: 06/23/2017 15:12   Dg Knee Complete 4 Views Left  Result Date: 06/22/2017 CLINICAL DATA:  Injury with patella dislocation 1 week ago. Re-injury today. EXAM: LEFT KNEE - COMPLETE 4+ VIEW COMPARISON:  06/14/2017 FINDINGS: No evidence of fracture, dislocation, or joint effusion. No evidence of arthropathy or other focal bone abnormality. Soft tissues are unremarkable. IMPRESSION: Negative. Electronically Signed   By: Elberta Fortisaniel  Boyle M.D.   On: 06/22/2017 10:51    Procedures Procedures (including critical care time)  Medications Ordered in ED Medications  ketorolac (TORADOL) 30 MG/ML injection 30 mg (30 mg Intramuscular Given 06/23/17 1447)     Initial Impression / Assessment and Plan / ED Course  I have reviewed the triage vital signs and the nursing notes.  Pertinent labs & imaging results that were available during my care of the patient were reviewed by me and considered in my medical decision making (see chart for details).     Patient presents to ED for evaluation of ongoing left knee pain.  She was seen and evaluated by orthopedist today and [facility] for similar symptoms.  The symptoms all began approximately 10 days ago when she was seen and evaluated after being diagnosed with a left  patellar subluxation with reduction.  She states that she is here today because after she left her orthopedist office she slipped and again hyperextended her knee.  She is concerned that she did any worsening damage to her she is tearful during my examination.  Knee immobilizer in place.  She does have some edema of the lower extremity but no calf tenderness or popliteal tenderness noted.  2+ DP pulse noted.  No erythema or pallor noted.  X-ray returned as negative and unchanged from previous.  I reassured the patient and advised her that she should continue following up with orthopedist for further evaluation.  She states that her orthopedist prescribed a prescription strength anti-inflammatory that she will pick up when she leaves the ED.  I advised her to continue taking this and to practice rice therapy.  I have low suspicion for DVT, infectious or other vascular cause as the cause of her knee pain and suspect that this is ongoing from previous injuries.  Patient appears stable for discharge at this time.  Strict return precautions given.  Final Clinical Impressions(s) / ED Diagnoses   Final diagnoses:  Chronic pain of left knee    ED Discharge Orders    None     Portions of this note were generated with Dragon dictation software. Dictation errors may occur despite best attempts at proofreading.    Dietrich PatesKhatri, Bence Trapp, PA-C 06/23/17 1527    Lavera GuiseLiu, Dana Duo, MD 06/23/17 [phone]

## 2017-06-23 NOTE — ED Triage Notes (Signed)
Pt returning to ER for further pain management of left knee pain, has been seen multiple times in the last week for this, was seen by ortho this morning, states fell on her bottom getting into her vehicle this evening and "just wants to make sure she didn't hurt it more."

## 2017-06-23 NOTE — ED Notes (Signed)
Patient transported to x-ray. ?

## 2017-06-23 NOTE — ED Notes (Signed)
Patient verbalized understanding of discharge instructions and denies any further needs or questions at this time. VS stable. Assisted to ED entrance in wheelchair.   

## 2017-06-23 NOTE — ED Notes (Signed)
Ortho tech at bedside educating and showing patient proper placement of knee immobilizer.

## 2017-06-23 NOTE — Discharge Instructions (Signed)
Please read attached information regarding your condition. Continue your home pain medications as needed. Follow-up with orthopedic specialist for further evaluation. Wear knee immobilizer and use crutches as directed. Return to ED for worsening symptoms, injuries or falls, numbness in legs, red hot or tender joint.

## 2017-08-12 ENCOUNTER — Emergency Department (HOSPITAL_BASED_OUTPATIENT_CLINIC_OR_DEPARTMENT_OTHER)
Admission: EM | Admit: 2017-08-12 | Discharge: 2017-08-12 | Disposition: A | Discharge status: Home / Self Care | Payer: Medicaid Other | Attending: Physician Assistant | Admitting: Physician Assistant

## 2017-08-12 ENCOUNTER — Other Ambulatory Visit: Payer: Self-pay

## 2017-08-12 ENCOUNTER — Emergency Department (HOSPITAL_BASED_OUTPATIENT_CLINIC_OR_DEPARTMENT_OTHER): Payer: Medicaid Other

## 2017-08-12 ENCOUNTER — Encounter (HOSPITAL_BASED_OUTPATIENT_CLINIC_OR_DEPARTMENT_OTHER): Payer: Self-pay

## 2017-08-12 DIAGNOSIS — Y929 Unspecified place or not applicable: Secondary | ICD-10-CM | POA: Diagnosis not present

## 2017-08-12 DIAGNOSIS — S46912A Strain of unspecified muscle, fascia and tendon at shoulder and upper arm level, left arm, initial encounter: Secondary | ICD-10-CM

## 2017-08-12 DIAGNOSIS — Y9389 Activity, other specified: Secondary | ICD-10-CM | POA: Insufficient documentation

## 2017-08-12 DIAGNOSIS — S43402A Unspecified sprain of left shoulder joint, initial encounter: Secondary | ICD-10-CM | POA: Diagnosis not present

## 2017-08-12 DIAGNOSIS — S8392XA Sprain of unspecified site of left knee, initial encounter: Secondary | ICD-10-CM | POA: Diagnosis not present

## 2017-08-12 DIAGNOSIS — Z79899 Other long term (current) drug therapy: Secondary | ICD-10-CM | POA: Diagnosis not present

## 2017-08-12 DIAGNOSIS — Z87891 Personal history of nicotine dependence: Secondary | ICD-10-CM | POA: Insufficient documentation

## 2017-08-12 DIAGNOSIS — Y998 Other external cause status: Secondary | ICD-10-CM | POA: Diagnosis not present

## 2017-08-12 DIAGNOSIS — S4992XA Unspecified injury of left shoulder and upper arm, initial encounter: Secondary | ICD-10-CM | POA: Diagnosis present

## 2017-08-12 LAB — PREGNANCY, URINE: Preg Test, Ur: NEGATIVE

## 2017-08-12 MED ORDER — ACETAMINOPHEN 325 MG PO TABS
650.0000 mg | ORAL_TABLET | Freq: Once | ORAL | Status: DC
  Filled 2017-08-12: qty 2
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
## 2017-08-12 NOTE — ED Notes (Signed)
Pt appears lethargic during assessment, but is responding appropriately.

## 2017-08-12 NOTE — Discharge Instructions (Signed)
Wear the knee immobilizer and use the crutches for support and stabilization until you are able to speak seen by your orthopedic doctor.  Use the sling for support and stabilization.  Do not keep your shoulder and therefore 24 hours a day.  Routinely remove the shoulder from the sling and do range of motion exercises to prevent frozen shoulder. .  You can take Tylenol or Ibuprofen as directed for pain. You can alternate Tylenol and Ibuprofen every 4 hours. If you take Tylenol at 1pm, then you can take Ibuprofen at 5pm. Then you can take Tylenol again at 9pm.   Follow the RICE (Rest, Ice, Compression, Elevation) protocol as directed.    Return to the emergency department for any worsening pain, numbness weakness of the leg, difficulty in bleeding, redness or swelling of the shoulder or knee or any other worsening or concerning symptoms.
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
## 2017-08-12 NOTE — ED Notes (Signed)
ED Provider at bedside. 

## 2017-10-21 ENCOUNTER — Emergency Department (HOSPITAL_BASED_OUTPATIENT_CLINIC_OR_DEPARTMENT_OTHER)
Admission: EM | Admit: 2017-10-21 | Discharge: 2017-10-21 | Disposition: A | Discharge status: Home / Self Care | Payer: Medicaid Other | Attending: Emergency Medicine | Admitting: Emergency Medicine

## 2017-10-21 ENCOUNTER — Encounter (HOSPITAL_BASED_OUTPATIENT_CLINIC_OR_DEPARTMENT_OTHER): Payer: Self-pay | Admitting: Emergency Medicine

## 2017-10-21 ENCOUNTER — Other Ambulatory Visit: Payer: Self-pay

## 2017-10-21 ENCOUNTER — Emergency Department (HOSPITAL_BASED_OUTPATIENT_CLINIC_OR_DEPARTMENT_OTHER): Payer: Medicaid Other

## 2017-10-21 DIAGNOSIS — S0990XA Unspecified injury of head, initial encounter: Secondary | ICD-10-CM | POA: Insufficient documentation

## 2017-10-21 DIAGNOSIS — Z87891 Personal history of nicotine dependence: Secondary | ICD-10-CM | POA: Insufficient documentation

## 2017-10-21 DIAGNOSIS — W01190A Fall on same level from slipping, tripping and stumbling with subsequent striking against furniture, initial encounter: Secondary | ICD-10-CM | POA: Diagnosis not present

## 2017-10-21 DIAGNOSIS — Z79899 Other long term (current) drug therapy: Secondary | ICD-10-CM | POA: Diagnosis not present

## 2017-10-21 DIAGNOSIS — Y939 Activity, unspecified: Secondary | ICD-10-CM | POA: Diagnosis not present

## 2017-10-21 DIAGNOSIS — Y999 Unspecified external cause status: Secondary | ICD-10-CM | POA: Diagnosis not present

## 2017-10-21 DIAGNOSIS — S022XXA Fracture of nasal bones, initial encounter for closed fracture: Secondary | ICD-10-CM | POA: Diagnosis not present

## 2017-10-21 DIAGNOSIS — Y929 Unspecified place or not applicable: Secondary | ICD-10-CM | POA: Diagnosis not present

## 2017-10-21 MED ORDER — ACETAMINOPHEN 500 MG PO TABS
1000.0000 mg | ORAL_TABLET | Freq: Once | ORAL | Status: AC
  Administered 2017-10-21: 1000 mg via ORAL
  Filled 2017-10-21: qty 2

## 2017-10-21 NOTE — Discharge Instructions (Addendum)
Make an appointment to follow-up with your [place] ear nose and throat doctor that you have seen before.  Is important that you were seen within the next 2 weeks.  If you are unable to get in with them referral given to our on-call ear nose and throat specialist Dr. Jenne PaneBates.  Return for any new or worse symptoms which could be nosebleed.  Be careful and do not mess with the nose.  Take the medications that you have at home for pain.  CT head was negative CT neck negative x-rays of both knees negative the CT of the face showed the bilateral nasal bone fractures with some displacement.  No other facial fractures.
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
## 2017-11-09 ENCOUNTER — Emergency Department (HOSPITAL_BASED_OUTPATIENT_CLINIC_OR_DEPARTMENT_OTHER): Payer: Medicaid Other

## 2017-11-09 ENCOUNTER — Other Ambulatory Visit: Payer: Self-pay

## 2017-11-09 ENCOUNTER — Emergency Department (HOSPITAL_BASED_OUTPATIENT_CLINIC_OR_DEPARTMENT_OTHER)
Admission: EM | Admit: 2017-11-09 | Discharge: 2017-11-09 | Disposition: A | Discharge status: Home / Self Care | Payer: Medicaid Other | Attending: Emergency Medicine | Admitting: Emergency Medicine

## 2017-11-09 ENCOUNTER — Encounter (HOSPITAL_BASED_OUTPATIENT_CLINIC_OR_DEPARTMENT_OTHER): Payer: Self-pay | Admitting: Emergency Medicine

## 2017-11-09 DIAGNOSIS — Z9889 Other specified postprocedural states: Secondary | ICD-10-CM | POA: Insufficient documentation

## 2017-11-09 DIAGNOSIS — J3489 Other specified disorders of nose and nasal sinuses: Secondary | ICD-10-CM | POA: Insufficient documentation

## 2017-11-09 DIAGNOSIS — Y93F9 Activity, other caregiving: Secondary | ICD-10-CM | POA: Insufficient documentation

## 2017-11-09 DIAGNOSIS — R51 Headache: Secondary | ICD-10-CM | POA: Insufficient documentation

## 2017-11-09 DIAGNOSIS — Y990 Civilian activity done for income or pay: Secondary | ICD-10-CM | POA: Diagnosis not present

## 2017-11-09 DIAGNOSIS — Z87891 Personal history of nicotine dependence: Secondary | ICD-10-CM | POA: Insufficient documentation

## 2017-11-09 DIAGNOSIS — R040 Epistaxis: Secondary | ICD-10-CM | POA: Insufficient documentation

## 2017-11-09 DIAGNOSIS — R519 Headache, unspecified: Secondary | ICD-10-CM

## 2017-11-09 DIAGNOSIS — W500XXA Accidental hit or strike by another person, initial encounter: Secondary | ICD-10-CM | POA: Diagnosis not present

## 2017-11-09 DIAGNOSIS — Y929 Unspecified place or not applicable: Secondary | ICD-10-CM | POA: Diagnosis not present

## 2017-11-09 DIAGNOSIS — Z79899 Other long term (current) drug therapy: Secondary | ICD-10-CM | POA: Insufficient documentation

## 2017-11-09 MED ORDER — DIPHENHYDRAMINE HCL 50 MG/ML IJ SOLN
INTRAMUSCULAR | Status: AC
  Filled 2017-11-09: qty 1

## 2017-11-09 MED ORDER — HYDROCODONE-ACETAMINOPHEN 5-325 MG PO TABS
1.0000 | ORAL_TABLET | Freq: Once | ORAL | Status: AC
  Administered 2017-11-09: 1 via ORAL
  Filled 2017-11-09: qty 1

## 2017-11-09 MED ORDER — PROCHLORPERAZINE EDISYLATE 10 MG/2ML IJ SOLN
10.0000 mg | Freq: Once | INTRAMUSCULAR | Status: AC
  Administered 2017-11-09: 10 mg via INTRAVENOUS
  Filled 2017-11-09: qty 2

## 2017-11-09 MED ORDER — PROCHLORPERAZINE EDISYLATE 10 MG/2ML IJ SOLN
INTRAMUSCULAR | Status: AC
  Filled 2017-11-09: qty 2

## 2017-11-09 MED ORDER — DIPHENHYDRAMINE HCL 50 MG/ML IJ SOLN
25.0000 mg | Freq: Once | INTRAMUSCULAR | Status: AC
  Administered 2017-11-09: 25 mg via INTRAVENOUS
  Filled 2017-11-09: qty 1

## 2017-11-09 MED ORDER — HYDROCODONE-ACETAMINOPHEN 5-325 MG PO TABS: 1.0000 | ORAL_TABLET | ORAL | 0 refills | Status: AC | PRN

## 2017-11-09 MED ORDER — SODIUM CHLORIDE 0.9 % IV BOLUS
1000.0000 mL | Freq: Once | INTRAVENOUS | Status: AC
  Administered 2017-11-09: 1000 mL via INTRAVENOUS
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
## 2018-07-06 ENCOUNTER — Emergency Department (HOSPITAL_COMMUNITY)
Admission: EM | Admit: 2018-07-06 | Discharge: 2018-07-06 | Disposition: A | Discharge status: Home / Self Care | Payer: Medicaid Other | Attending: Emergency Medicine | Admitting: Emergency Medicine

## 2018-07-06 ENCOUNTER — Emergency Department (HOSPITAL_COMMUNITY): Payer: Medicaid Other

## 2018-07-06 ENCOUNTER — Encounter (HOSPITAL_COMMUNITY): Payer: Self-pay

## 2018-07-06 DIAGNOSIS — Z87891 Personal history of nicotine dependence: Secondary | ICD-10-CM | POA: Diagnosis not present

## 2018-07-06 DIAGNOSIS — E876 Hypokalemia: Secondary | ICD-10-CM

## 2018-07-06 DIAGNOSIS — Z79899 Other long term (current) drug therapy: Secondary | ICD-10-CM | POA: Diagnosis not present

## 2018-07-06 DIAGNOSIS — R519 Headache, unspecified: Secondary | ICD-10-CM

## 2018-07-06 DIAGNOSIS — R51 Headache: Secondary | ICD-10-CM | POA: Diagnosis not present

## 2018-07-06 LAB — URINALYSIS, ROUTINE W REFLEX MICROSCOPIC
Bilirubin Urine: NEGATIVE
Glucose, UA: NEGATIVE mg/dL
Ketones, ur: 5 mg/dL — AB
Leukocytes, UA: NEGATIVE
Nitrite: NEGATIVE
Protein, ur: NEGATIVE mg/dL
Specific Gravity, Urine: 1.004 — ABNORMAL LOW (ref 1.005–1.030)
pH: 6 (ref 5.0–8.0)

## 2018-07-06 LAB — COMPREHENSIVE METABOLIC PANEL
ALT: 17 U/L (ref 0–44)
AST: 18 U/L (ref 15–41)
Albumin: 4.2 g/dL (ref 3.5–5.0)
Alkaline Phosphatase: 79 U/L (ref 38–126)
Anion gap: 12 (ref 5–15)
BUN: 7 mg/dL (ref 6–20)
CO2: 16 mmol/L — ABNORMAL LOW (ref 22–32)
Calcium: 9.4 mg/dL (ref 8.9–10.3)
Chloride: 111 mmol/L (ref 98–111)
Creatinine, Ser: 0.95 mg/dL (ref 0.44–1.00)
GFR calc Af Amer: >60 mL/min (ref >60)
GFR calc non Af Amer: >60 mL/min (ref >60)
Glucose, Bld: 100 mg/dL — ABNORMAL HIGH (ref 70–99)
Potassium: 3.3 mmol/L — ABNORMAL LOW (ref 3.5–5.1)
Sodium: 139 mmol/L (ref 135–145)
Total Bilirubin: 0.6 mg/dL (ref 0.3–1.2)
Total Protein: 7.3 g/dL (ref 6.5–8.1)

## 2018-07-06 LAB — CBC WITH DIFFERENTIAL/PLATELET
Abs Immature Granulocytes: 0.03 10*3/uL (ref 0.00–0.07)
Basophils Absolute: 0.1 10*3/uL (ref 0.0–0.1)
Basophils Relative: 1 %
Eosinophils Absolute: 0.1 10*3/uL (ref 0.0–0.5)
Eosinophils Relative: 1 %
HCT: 42.1 % (ref 36.0–46.0)
Hemoglobin: 14.3 g/dL (ref 12.0–15.0)
Immature Granulocytes: 0 %
Lymphocytes Relative: 19 %
Lymphs Abs: 1.7 10*3/uL (ref 0.7–4.0)
MCH: 30.4 pg (ref 26.0–34.0)
MCHC: 34 g/dL (ref 30.0–36.0)
MCV: 89.4 fL (ref 80.0–100.0)
Monocytes Absolute: 0.5 10*3/uL (ref 0.1–1.0)
Monocytes Relative: 6 %
Neutro Abs: 6.7 10*3/uL (ref 1.7–7.7)
Neutrophils Relative %: 73 %
Platelets: 356 10*3/uL (ref 150–400)
RBC: 4.71 MIL/uL (ref 3.87–5.11)
RDW: 12.3 % (ref 11.5–15.5)
WBC: 9.2 10*3/uL (ref 4.0–10.5)
nRBC: 0 % (ref 0.0–0.2)

## 2018-07-06 LAB — I-STAT BETA HCG BLOOD, ED (MC, WL, AP ONLY): I-stat hCG, quantitative: <5 m[IU]/mL (ref <5)

## 2018-07-06 LAB — LIPASE, BLOOD: Lipase: 27 U/L (ref 11–51)

## 2018-07-06 MED ORDER — POTASSIUM CHLORIDE ER 10 MEQ PO TBCR: 10.0000 meq | EXTENDED_RELEASE_TABLET | Freq: Every day | ORAL | 0 refills | Status: AC

## 2018-07-06 NOTE — ED Notes (Signed)
Patient verbalizes understanding of discharge instructions. Opportunity for questioning and answers were provided. Armband removed by staff, pt discharged from ED ambulatory.   

## 2018-07-06 NOTE — ED Notes (Signed)
Patient ambulatory to bathroom with no complaints

## 2018-07-06 NOTE — ED Provider Notes (Signed)
[facility]   CSN: [id number] Arrival date & time: 07/06/18  1417     History   Chief Complaint Chief Complaint  Patient presents with  . Headache    HPI Gabriela Benitez is a 38 y.o. female with history of anxiety, chronic back and neck pain, esophageal spasm, GERD, and migraines for evaluation of acute onset, significantly improved headache beginning at around 2 PM.  She reports that she was in a legal parking lot when she began to have a severe posterior headache that "felt like an alien was ripping out of my skull".  The headache radiated "all over" her head and was associated with blurred vision, photophobia, lightheadedness, and nausea.  No vomiting, numbness, tingling, slurred speech, or facial droop.  Her pastor is at the bedside and was with her when she began having the headache and states that she appeared very "dazed" and "staring off".  Her pastor states "I put my hands on the back of her head and I could feel it pulsating ".  She received a shot of Toradol with EMS and reports that the headache suddenly resolved approximately 20 minutes prior to my assessment while she was in the ED.  Presently she denies mild headache to the right occipital region.  No fevers or head trauma.  She was being seen by her PCP earlier today for headaches and lightheadedness and states that she has been experiencing intermittent headaches and lightheadedness daily for the last month.  She states that while she was ambulating in the doctor's office she "blacked out"  and needed to steady herself against the wall and nurse.  She does remember the episode but states that "everything went black for a few seconds ".    She is also being evaluated by her PCP for chronic abdominal pains with intermittent constipation and diarrhea for the last year or so.  She notes pain after urinating and having bowel movements that will radiate up the midline of her chest  and she will feel as though she cannot catch her breath "until I remind myself to breathe ".  This is currently being managed by her PCP and she was on the way to get a chest x-ray and CT abdomen and pelvis outpatient when her headache began prompting presentation to the ED.   The history is provided by the patient.    Past Medical History:  Diagnosis Date  . Anxiety   . Back pain   . Carpal tunnel syndrome on both sides   . Chronic back pain   . Chronic neck pain   . Depression   . Elevated LFTs    acute pyleonephritis  . Esophageal spasm    uses aloe vera with success  . GERD (gastroesophageal reflux disease)    Zantac  . Migraine     There are no active problems to display for this patient.   Past Surgical History:  Procedure Laterality Date  . CESAREAN SECTION  2007  . CESAREAN SECTION  01/27/2012   Procedure: CESAREAN SECTION;  Surgeon: Delbert Harnessaniel H Moore, MD;  Location: [facility];  Service: Gynecology;  Laterality: N/A;  . CHOLECYSTECTOMY    . DILATION AND CURETTAGE OF UTERUS    . NOSE SURGERY    . TONSILLECTOMY       OB History    Gravida  3   Para  2   Term  2   Preterm      AB  1   Living  2     SAB  1   TAB      Ectopic      Multiple      Live Births  2            Home Medications    Prior to Admission medications   Medication Sig Start Date End Date Taking? Authorizing Provider  EPINEPHrine 0.3 mg/0.3 mL IJ SOAJ injection Inject 0.3 mg into the muscle as needed. 12/26/15   [provider]  fluticasone (FLONASE) 50 MCG/ACT nasal spray Place 1 spray into the nose 2 (two) times daily.    [provider]  fluvoxaMINE (LUVOX) 100 MG tablet Take 1 tablet by mouth as directed. One tablet in the morning and 1.5 at night 10/13/17   [provider]  HYDROcodone-acetaminophen (NORCO/VICODIN) 5-325 MG tablet Take 1 tablet by mouth every 4 (four) hours as needed. 11/09/17   Tegeler, Canary Brim, MD  linaclotide Karlene Einstein) 145  MCG CAPS capsule Take 1 capsule (145 mcg total) by mouth daily before breakfast. 10/28/16   Esterwood, Amy S, PA-C  medroxyPROGESTERone (DEPO-PROVERA) 150 MG/ML injection Inject 1 mL into the muscle every 3 (three) months. 07/27/17   [provider]  Papaya 100 MG TABS Take 4 tablets by mouth 3 (three) times daily.    [provider]  potassium chloride (K-DUR) 10 MEQ tablet Take 1 tablet (10 mEq total) by mouth daily for 5 days. 07/06/18 07/11/18  Michela Pitcher A, PA-C  ranitidine (ZANTAC) 150 MG tablet Take 1 tablet by mouth 2 (two) times daily.    [provider]    Family History Family History  Problem Relation Age of Onset  . Stomach cancer Father   . Colon cancer Paternal Grandmother   . Colon polyps Neg Hx   . Rectal cancer Neg Hx     Social History Social History   Tobacco Use  . Smoking status: Former Smoker    Packs/day: 0.25    Years: 11.00    Pack years: 2.75  . Smokeless tobacco: Never Used  Substance Use Topics  . Alcohol use: No    Frequency: Never  . Drug use: Yes    Types: Marijuana    Comment: used lesterday     Allergies   Morphine and related; Hydrocodone-acetaminophen; Miralax [polyethylene glycol]; Codeine; and Eggs or egg-derived products   Review of Systems Review of Systems  Constitutional: Negative for chills and fever.  Eyes: Positive for photophobia and visual disturbance.  Respiratory: Positive for shortness of breath.   Cardiovascular: Positive for chest pain.  Gastrointestinal: Positive for abdominal pain, constipation, diarrhea and nausea. Negative for vomiting.  Genitourinary: Positive for dysuria.  Neurological: Positive for syncope, light-headedness and headaches. Negative for weakness and numbness.  All other systems reviewed and are negative.    Physical Exam Updated Vital Signs BP (!) 129/94 (BP Location: Right Arm)   Pulse 60   Temp 97.9 F (36.6 C) (Oral)   Resp 16   Ht 5\' 4"  (1.626 m)   Wt 96.2 kg    SpO2 100%   BMI 36.39 kg/m   Physical Exam Vitals signs and nursing note reviewed.  Constitutional:      General: She is not in acute distress.    Appearance: She is well-developed.  HENT:     Head: Normocephalic and atraumatic.  Eyes:     General:        Right eye: No discharge.  Left eye: No discharge.     Conjunctiva/sclera: Conjunctivae normal.  Neck:     Musculoskeletal: Normal range of motion and neck supple.     Vascular: No JVD.     Trachea: No tracheal deviation.  Cardiovascular:     Rate and Rhythm: Normal rate.     Heart sounds: Normal heart sounds.  Pulmonary:     Effort: Pulmonary effort is normal.     Breath sounds: Normal breath sounds.  Abdominal:     General: Bowel sounds are normal. There is no distension.     Palpations: Abdomen is soft.     Tenderness: There is generalized abdominal tenderness. There is no right CVA tenderness, left CVA tenderness or guarding. Negative signs include Murphy's sign.  Skin:    General: Skin is warm and dry.     Findings: No erythema.  Neurological:     Mental Status: She is alert and oriented to person, place, and time.     GCS: GCS eye subscore is 4. GCS verbal subscore is 5. GCS motor subscore is 6.     Cranial Nerves: No cranial nerve deficit, dysarthria or facial asymmetry.     Coordination: Romberg sign negative. Coordination normal.     Comments: Mental Status:  Alert, thought content appropriate, able to give a coherent history. Speech fluent without evidence of aphasia. Able to follow 2 step commands without difficulty.  Cranial Nerves:  II:  Peripheral visual fields grossly normal, pupils equal, round, reactive to light III,IV, VI: ptosis not present, extra-ocular motions intact bilaterally  V,VII: smile symmetric, facial light touch sensation equal VIII: hearing grossly normal to voice  X: uvula elevates symmetrically  XI: bilateral shoulder shrug symmetric and strong XII: midline tongue extension  without fassiculations Motor:  Normal tone. 5/5 strength of BUE and BLE major muscle groups including strong and equal grip strength and dorsiflexion/plantar flexion Sensory: light touch normal in all extremities. Cerebellar: normal finger-to-nose with bilateral upper extremities Gait: normal gait and balance. Able to walk on toes and heels with ease.    Psychiatric:        Behavior: Behavior normal.      ED Treatments / Results  Labs (all labs ordered are listed, but only abnormal results are displayed) Labs Reviewed  COMPREHENSIVE METABOLIC PANEL - Abnormal; Notable for the following components:      Result Value   Potassium 3.3 (*)    CO2 16 (*)    Glucose, Bld 100 (*)    All other components within normal limits  URINALYSIS, ROUTINE W REFLEX MICROSCOPIC - Abnormal; Notable for the following components:   Specific Gravity, Urine 1.004 (*)    Hgb urine dipstick SMALL (*)    Ketones, ur 5 (*)    Bacteria, UA RARE (*)    All other components within normal limits  CBC WITH DIFFERENTIAL/PLATELET  LIPASE, BLOOD  I-STAT BETA HCG BLOOD, ED ([facility], [facility], [facility] ONLY)    EKG EKG Interpretation  Date/Time:  Wednesday July 06 2018 14:26:04 EST Ventricular Rate:  84 PR Interval:    QRS Duration: 92 QT Interval:  397 QTC Calculation: 470 R Axis:   51 Text Interpretation:  Sinus rhythm Short PR interval Low voltage, precordial leads Nonspecific repol abnormality, inferior leads Baseline wander in lead(s) II III aVF V2 Artifact Otherwise no significant change Confirmed by Linwood Dibbles [id number]) on 07/06/2018 3:42:43 PM   Radiology Dg Chest 2 View  Result Date: 07/06/2018 CLINICAL DATA:  Short of breath with chest  pain EXAM: CHEST - 2 VIEW COMPARISON:  12/25/2016 FINDINGS: The heart size and mediastinal contours are within normal limits. Both lungs are clear. The visualized skeletal structures are unremarkable. IMPRESSION: No active cardiopulmonary disease. Electronically Signed   By: Jasmine PangKim   Fujinaga M.D.   On: 07/06/2018 16:25   Ct Head Wo Contrast  Result Date: 07/06/2018 CLINICAL DATA:  Recent fall in parking lot with headaches, initial encounter EXAM: CT HEAD WITHOUT CONTRAST TECHNIQUE: Contiguous axial images were obtained from the base of the skull through the vertex without intravenous contrast. COMPARISON:  10/21/2017 FINDINGS: Brain: No evidence of acute infarction, hemorrhage, hydrocephalus, extra-axial collection or mass lesion/mass effect. Vascular: No hyperdense vessel or unexpected calcification. Skull: Normal. Negative for fracture or focal lesion. Sinuses/Orbits: No acute finding. Other: None. IMPRESSION: Normal head CT. Electronically Signed   By: Alcide CleverMark  Lukens M.D.   On: 07/06/2018 16:52    Procedures Procedures (including critical care time)  Medications Ordered in ED Medications - No data to display   Initial Impression / Assessment and Plan / ED Course  I have reviewed the triage vital signs and the nursing notes.  Pertinent labs & imaging results that were available during my care of the patient were reviewed by me and considered in my medical decision making (see chart for details).     Patient presenting for evaluation of sudden onset headache.  This apparently resolved almost entirely 20 minutes prior to my assessment.  She is afebrile, vital signs are stable.  She is nontoxic in appearance.  She has a normal neurologic exam and no focal neurologic deficits.  She also discussed multiple chronic complaints that her PCP is managing.  Will obtain head imaging to rule out subarachnoid hemorrhage.  Labs show mild hypokalemia, UA does not suggest UTI or nephrolithiasis.  No leukocytosis, no anemia, no metabolic derangements.  No renal insufficiency.  LFTs within normal limits.  Chest x-ray shows no acute cardiopulmonary abnormalities.  EKG shows no significant changes, no evidence of ACS/MI.  Her symptoms regarding her abdominal pain and chest pain sound quite  atypical and her PCP is currently managing this.  Head CT shows no acute intracranial abnormalities.  No evidence of CVA, ICH, SAH, or meningitis.  Patient has been entirely asymptomatic while she has been in the ED.  On reevaluation she is resting comfortably in no apparent distress.  She is requesting discharge home.  I informed her that she was mildly hypokalemic but instead of oral replenishment in the ED she would like a prescription to go home with which I think is reasonable.  Recommend follow-up with PCP, neurology, and gastroenterology for management of patient's headache and abdominal pains.  Discussed strict ED return precautions. Pt verbalized understanding of and agreement with plan and is safe for discharge home at this time.  Discussed with Dr. Lynelle DoctorKnapp who agrees with assessment and plan at this time.  Final Clinical Impressions(s) / ED Diagnoses   Final diagnoses:  Bad headache  Hypokalemia    ED Discharge Orders         Ordered    potassium chloride (K-DUR) 10 MEQ tablet  Daily     07/06/18 1740           Jeanie SewerFawze, Per Beagley A, PA-C 07/07/18 [id number]    Linwood DibblesKnapp, Jon, MD 07/07/18 [phone]

## 2018-07-06 NOTE — ED Notes (Signed)
Patient transported to X-ray 

## 2018-07-06 NOTE — ED Triage Notes (Addendum)
Pt from [employer] parking lot via ems; c/o HA that began 20 minutes ago; off and on HA x 2 weeks; seen at PCP for same today; stroke screen neg; pt also c/o constipation and diarrhea x 1 year  125/93 HR 120 RR 20 95% RA

## 2018-07-06 NOTE — Discharge Instructions (Addendum)
Your work-up was reassuring that you are not having a brain bleed.  Your potassium was a little bit low today and your urine test showed that you may be a little dehydrated.  Drink plenty of water and get plenty of rest.  You can take potassium once daily for the next 5 days.  Follow-up with your primary care physician for reevaluation of your symptoms.  It may also be helpful to follow-up with a neurologist for your headaches and a gastroenterologist for your diarrhea/constipation and abdominal pain.  Return to the emergency department if any concerning signs or symptoms develop such as high fevers, worsening headaches, altered mental status, slurred speech, facial droop, passing out, persistent vomiting, or worsening abdominal pain.

## 2018-07-07 ENCOUNTER — Encounter (HOSPITAL_BASED_OUTPATIENT_CLINIC_OR_DEPARTMENT_OTHER): Payer: Self-pay | Admitting: *Deleted

## 2018-07-07 ENCOUNTER — Emergency Department (HOSPITAL_BASED_OUTPATIENT_CLINIC_OR_DEPARTMENT_OTHER)
Admission: EM | Admit: 2018-07-07 | Discharge: 2018-07-07 | Disposition: A | Discharge status: Home / Self Care | Payer: Medicaid Other | Attending: Emergency Medicine | Admitting: Emergency Medicine

## 2018-07-07 ENCOUNTER — Other Ambulatory Visit: Payer: Self-pay

## 2018-07-07 ENCOUNTER — Emergency Department (HOSPITAL_BASED_OUTPATIENT_CLINIC_OR_DEPARTMENT_OTHER): Payer: Medicaid Other

## 2018-07-07 DIAGNOSIS — T80818A Extravasation of other vesicant agent, initial encounter: Secondary | ICD-10-CM | POA: Insufficient documentation

## 2018-07-07 DIAGNOSIS — T148XXA Other injury of unspecified body region, initial encounter: Secondary | ICD-10-CM

## 2018-07-07 DIAGNOSIS — M79641 Pain in right hand: Secondary | ICD-10-CM | POA: Diagnosis not present

## 2018-07-07 DIAGNOSIS — Z87891 Personal history of nicotine dependence: Secondary | ICD-10-CM | POA: Diagnosis not present

## 2018-07-07 DIAGNOSIS — Z79899 Other long term (current) drug therapy: Secondary | ICD-10-CM | POA: Diagnosis not present

## 2018-07-07 DIAGNOSIS — Y828 Other medical devices associated with adverse incidents: Secondary | ICD-10-CM | POA: Insufficient documentation

## 2018-07-07 NOTE — ED Notes (Signed)
[facility] notified (Tammy) - patient ready for transport to [facility] ED 

## 2018-07-07 NOTE — ED Provider Notes (Signed)
Patient is a 38 year old female who presents as a transfer from [facility] after being seen and evaluated for concern of contrast extravasation in the right hand.  Patient was transferred to this emergency department for further evaluation and management by hand surgery for concern for compartment syndrome.  On arrival patient has a Doppler radial pulse and is able to flex and extend all digits as well as flex and extend her wrist.  Sensation is intact throughout all digits in the palm and the wrist.  Patient has no other acute complaints on presentation.  She states the skin began approximately 1500 hrs. today.  Hand surgery service consult on patient arrival.  In addition x-rays obtained at [facility] were reviewed and found to be remarkable for contrast extravasation and swelling without any fractures or dislocations.   Antoine Primas, MD 07/07/18 0938    Tegeler, Canary Brim, MD 07/08/18 1037

## 2018-07-07 NOTE — ED Notes (Signed)
Sx consult at bedside

## 2018-07-07 NOTE — Consult Note (Signed)
Gabriela GraffKimberly S Benitez is an 38 y.o. female.   Chief Complaint: Right hand and forearm swelling HPI: [age] year old right-hand-dominant female present with her husband.  She states that she was scheduled to have a contrast CT this afternoon approximately 3:00 when the contrast extravasated into the dorsum of her right hand and distal forearm.  Per available notes approximately 40 cc of Omnipaque 350 was extravasated.  She was observed and discharged with instructions for elevation and ice.  She noted increased swelling and was told to come to an ER for evaluation.  She rates her pain at 4 out of 10 in severity.  Symptoms are alleviated with rest and aggravated with motion.  She feels the pain has gotten better.  The swelling is still present.  Case discussed with Felicie Mornavid Smith, NP and his note from 07/07/2018 reviewed. Xrays viewed and interpreted by me: AP and lateral views of the right hand and forearm show no fractures dislocations.  There is increased radiodensity in the dorsal soft tissues consistent with the extravasated contrast. Labs reviewed: WBC 9.2  Allergies:  Allergies  Allergen Reactions  . Morphine And Related Other (See Comments)    Esophageal spasm  . Hydrocodone-Acetaminophen     Causes spasms  . Miralax [Polyethylene Glycol] Itching  . Codeine Other (See Comments)    Migraines and esophageal spasm  . Eggs Or Egg-Derived Products Other (See Comments)    Migraine     Past Medical History:  Diagnosis Date  . Anxiety   . Back pain   . Carpal tunnel syndrome on both sides   . Chronic back pain   . Chronic neck pain   . Depression   . Elevated LFTs    acute pyleonephritis  . Esophageal spasm    uses aloe vera with success  . GERD (gastroesophageal reflux disease)    Zantac  . Migraine     Past Surgical History:  Procedure Laterality Date  . CESAREAN SECTION  2007  . CESAREAN SECTION  01/27/2012   Procedure: CESAREAN SECTION;  Surgeon: Delbert Harnessaniel H Moore, MD;  Location: [facility];   Service: Gynecology;  Laterality: N/A;  . CHOLECYSTECTOMY    . DILATION AND CURETTAGE OF UTERUS    . NOSE SURGERY    . TONSILLECTOMY      Family History: Family History  Problem Relation Age of Onset  . Stomach cancer Father   . Colon cancer Paternal Grandmother   . Colon polyps Neg Hx   . Rectal cancer Neg Hx     Social History:   reports that she has quit smoking. She has a 2.75 pack-year smoking history. She has never used smokeless tobacco. She reports current drug use. Drug: Marijuana. She reports that she does not drink alcohol.  Medications: (Not in a hospital admission)   Results for orders placed or performed during the hospital encounter of 07/06/18 (from the past 48 hour(s))  Comprehensive metabolic panel     Status: Abnormal   Collection Time: 07/06/18  3:26 PM  Result Value Ref Range   Sodium 139 135 - 145 mmol/L   Potassium 3.3 (L) 3.5 - 5.1 mmol/L   Chloride 111 98 - 111 mmol/L   CO2 16 (L) 22 - 32 mmol/L   Glucose, Bld 100 (H) 70 - 99 mg/dL   BUN 7 6 - 20 mg/dL   Creatinine, Ser 1.610.95 0.44 - 1.00 mg/dL   Calcium 9.4 8.9 - 09.610.3 mg/dL   Total Protein 7.3 6.5 - 8.1 g/dL  Albumin 4.2 3.5 - 5.0 g/dL   AST 18 15 - 41 U/L   ALT 17 0 - 44 U/L   Alkaline Phosphatase 79 38 - 126 U/L   Total Bilirubin 0.6 0.3 - 1.2 mg/dL   GFR calc non Af Amer >60 >60 mL/min   GFR calc Af Amer >60 >60 mL/min   Anion gap 12 5 - 15    Comment: Performed at [facility], [address] [place], [place] [address]  CBC with Differential     Status: None   Collection Time: 07/06/18  3:26 PM  Result Value Ref Range   WBC 9.2 4.0 - 10.5 K/uL   RBC 4.71 3.87 - 5.11 MIL/uL   Hemoglobin 14.3 12.0 - 15.0 g/dL   HCT 75.6 43.3 - 29.5 %   MCV 89.4 80.0 - 100.0 fL   MCH 30.4 26.0 - 34.0 pg   MCHC 34.0 30.0 - 36.0 g/dL   RDW 18.8 41.6 - 60.6 %   Platelets 356 150 - 400 K/uL   nRBC 0.0 0.0 - 0.2 %   Neutrophils Relative % 73 %   Neutro Abs 6.7 1.7 - 7.7 K/uL   Lymphocytes  Relative 19 %   Lymphs Abs 1.7 0.7 - 4.0 K/uL   Monocytes Relative 6 %   Monocytes Absolute 0.5 0.1 - 1.0 K/uL   Eosinophils Relative 1 %   Eosinophils Absolute 0.1 0.0 - 0.5 K/uL   Basophils Relative 1 %   Basophils Absolute 0.1 0.0 - 0.1 K/uL   Immature Granulocytes 0 %   Abs Immature Granulocytes 0.03 0.00 - 0.07 K/uL    Comment: Performed at [facility], [address] [place], [place] [address]  Lipase, blood     Status: None   Collection Time: 07/06/18  3:26 PM  Result Value Ref Range   Lipase 27 11 - 51 U/L    Comment: Performed at [facility], [address], [place], [place] [address]  Urinalysis, Routine w reflex microscopic     Status: Abnormal   Collection Time: 07/06/18  3:27 PM  Result Value Ref Range   Color, Urine YELLOW YELLOW   APPearance CLEAR CLEAR   Specific Gravity, Urine 1.004 (L) 1.005 - 1.030   pH 6.0 5.0 - 8.0   Glucose, UA NEGATIVE NEGATIVE mg/dL   Hgb urine dipstick SMALL (A) NEGATIVE   Bilirubin Urine NEGATIVE NEGATIVE   Ketones, ur 5 (A) NEGATIVE mg/dL   Protein, ur NEGATIVE NEGATIVE mg/dL   Nitrite NEGATIVE NEGATIVE   Leukocytes, UA NEGATIVE NEGATIVE   RBC / HPF 0-5 0 - 5 RBC/hpf   WBC, UA 0-5 0 - 5 WBC/hpf   Bacteria, UA RARE (A) NONE SEEN   Squamous Epithelial / LPF 0-5 0 - 5    Comment: Performed at [facility], [address], [place], [place] [address]  I-Stat beta hCG blood, ED     Status: None   Collection Time: 07/06/18  3:39 PM  Result Value Ref Range   I-stat hCG, quantitative <5.0 <5 mIU/mL   Comment 3            Comment:   GEST. AGE      CONC.  (mIU/mL)   <=1 WEEK        5 - 50     2 WEEKS       50 - 500     3 WEEKS  100 - 10,000     4 WEEKS     1,000 - 30,000        FEMALE AND NON-PREGNANT FEMALE:     LESS THAN 5 mIU/mL     Dg Chest 2 View  Result Date: 07/06/2018 CLINICAL DATA:  Short of breath with chest pain EXAM: CHEST - 2 VIEW COMPARISON:  12/25/2016 FINDINGS: The heart size and mediastinal  contours are within normal limits. Both lungs are clear. The visualized skeletal structures are unremarkable. IMPRESSION: No active cardiopulmonary disease. Electronically Signed   By: Jasmine PangKim  Fujinaga M.D.   On: 07/06/2018 16:25   Ct Head Wo Contrast  Result Date: 07/06/2018 CLINICAL DATA:  Recent fall in parking lot with headaches, initial encounter EXAM: CT HEAD WITHOUT CONTRAST TECHNIQUE: Contiguous axial images were obtained from the base of the skull through the vertex without intravenous contrast. COMPARISON:  10/21/2017 FINDINGS: Brain: No evidence of acute infarction, hemorrhage, hydrocephalus, extra-axial collection or mass lesion/mass effect. Vascular: No hyperdense vessel or unexpected calcification. Skull: Normal. Negative for fracture or focal lesion. Sinuses/Orbits: No acute finding. Other: None. IMPRESSION: Normal head CT. Electronically Signed   By: Alcide CleverMark  Lukens M.D.   On: 07/06/2018 16:52   Dg Hand 2 View Right  Result Date: 07/07/2018 CLINICAL DATA:  Hand swelling and pain, status post IV contrast extravasation EXAM: RIGHT HAND - 2 VIEW COMPARISON:  04/06/2017 FINDINGS: Diffuse dorsal hand and wrist soft tissue swelling with hyperattenuation compatible with extravasated contrast into the soft tissues. No acute osseous finding, joint abnormality, fracture, malalignment. Fourth digit ring noted. IMPRESSION: Diffuse dorsal hand and wrist soft tissue swelling and soft tissue contrast extravasation. Electronically Signed   By: Judie PetitM.  Shick M.D.   On: 07/07/2018 18:39     Review of Systems: Positive for chills, night sweats, chest pain, shortness of breath, nausea, vomiting, diarrhea, constipation, headaches, dizziness, vision changes.   Blood pressure (!) 88/64, pulse 77, temperature 98.2 F (36.8 C), temperature source Oral, resp. rate 18, height 5\' 4"  (1.626 m), weight 96.1 kg, SpO2 100 %, currently breastfeeding.  General appearance: alert, cooperative and appears stated age Head:  Normocephalic, without obvious abnormality, atraumatic Neck: supple, symmetrical, trachea midline Extremities: Intact sensation and capillary refill all digits.  +epl/fpl/io.  She has small wound on the dorsum of the right hand from the IV.  There is no surrounding erythema.  She is swollen in the hand both volarly and dorsally.  She has dorsal swelling in the distal forearm.  This is all soft and ballotable.  She is able to move her fingers almost into full extension and proximally halfway into flexion without pain.  Her flexion is limited by the swelling.  She can flex and extend the wrist without pain.  Radial pulse 2+.  Brisk capillary refill in all fingers. Pulses: 2+ and symmetric Skin: Skin color, texture, turgor normal. No rashes or lesions Neurologic: Grossly normal Incision/Wound: As above  Assessment/Plan Right hand contrast extravasation of approximately 40 cc of Omnipaque 350.  No compartment syndrome at this time.  Would recommend continued elevation and intermittent icing.  She is encouraged the swelling should start to go down over the next few days.  I will be happy to check her back in the office tomorrow morning or next week if she has any concerns.  She may contact us with any concerns.  We will have a volar wrist splint with gauze wrap placed for comfort.  She can remove this for hygiene as necessary.  She agrees with the plan of care.  Betha Loa 07/07/2018, 9:48 PM

## 2018-07-07 NOTE — ED Provider Notes (Signed)
[facility] EMERGENCY DEPARTMENT Provider Note   CSN: [id number] Arrival date & time: 07/07/18  1744     History   Chief Complaint Chief Complaint  Patient presents with  . Hand Pain    HPI Gabriela Benitez is a 38 y.o. female.  Patient presents to ED for evaluation of hand swelling secondary to extravasation of contrast material during CT procedure earlier today (around 1500). According to the radiology report, approximately 40 ml of contrast media was involved. Patient sent home with instructions to elevate and apply ice/cold packs. Despite the care, the swelling has increased, now involving the proximal fingers, dorsal and palmer surfaces of the right hand, and the lower forearm. Patient states the area feels very "tight". She ia able to flex fingers. Sensation remains intact. She has a ring on her right 4th finger, with swelling around the ring. She states she had not been able to remove the ring "for months". She does not want the ring to be cut off.  The history is provided by the patient and medical records. No language interpreter was used.  Hand Pain  This is a new problem. The problem has been gradually worsening. She has tried a cold compress for the symptoms. The treatment provided no relief.    Past Medical History:  Diagnosis Date  . Anxiety   . Back pain   . Carpal tunnel syndrome on both sides   . Chronic back pain   . Chronic neck pain   . Depression   . Elevated LFTs    acute pyleonephritis  . Esophageal spasm    uses aloe vera with success  . GERD (gastroesophageal reflux disease)    Zantac  . Migraine     There are no active problems to display for this patient.   Past Surgical History:  Procedure Laterality Date  . CESAREAN SECTION  2007  . CESAREAN SECTION  01/27/2012   Procedure: CESAREAN SECTION;  Surgeon: Delbert Harnessaniel H Moore, MD;  Location: [facility];  Service: Gynecology;  Laterality: N/A;  . CHOLECYSTECTOMY    . DILATION AND CURETTAGE  OF UTERUS    . NOSE SURGERY    . TONSILLECTOMY       OB History    Gravida  3   Para  2   Term  2   Preterm      AB  1   Living  2     SAB  1   TAB      Ectopic      Multiple      Live Births  2            Home Medications    Prior to Admission medications   Medication Sig Start Date End Date Taking? Authorizing Provider  EPINEPHrine 0.3 mg/0.3 mL IJ SOAJ injection Inject 0.3 mg into the muscle as needed. 12/26/15  Yes [provider]  fluticasone (FLONASE) 50 MCG/ACT nasal spray Place 1 spray into the nose 2 (two) times daily.   Yes [provider]  fluvoxaMINE (LUVOX) 100 MG tablet Take 1 tablet by mouth as directed. One tablet in the morning and 1.5 at night 10/13/17  Yes [provider]  linaclotide (LINZESS) 145 MCG CAPS capsule Take 1 capsule (145 mcg total) by mouth daily before breakfast. 10/28/16  Yes Esterwood, Amy S, PA-C  Papaya 100 MG TABS Take 4 tablets by mouth 3 (three) times daily.   Yes [provider]  potassium chloride (K-DUR) 10  MEQ tablet Take 1 tablet (10 mEq total) by mouth daily for 5 days. 07/06/18 07/11/18 Yes Fawze, Mina A, PA-C  HYDROcodone-acetaminophen (NORCO/VICODIN) 5-325 MG tablet Take 1 tablet by mouth every 4 (four) hours as needed. 11/09/17   Tegeler, Canary Brim, MD  medroxyPROGESTERone (DEPO-PROVERA) 150 MG/ML injection Inject 1 mL into the muscle every 3 (three) months. 07/27/17   [provider]  ranitidine (ZANTAC) 150 MG tablet Take 1 tablet by mouth 2 (two) times daily.    [provider]    Family History Family History  Problem Relation Age of Onset  . Stomach cancer Father   . Colon cancer Paternal Grandmother   . Colon polyps Neg Hx   . Rectal cancer Neg Hx     Social History Social History   Tobacco Use  . Smoking status: Former Smoker    Packs/day: 0.25    Years: 11.00    Pack years: 2.75  . Smokeless tobacco: Never Used  Substance Use Topics  .  Alcohol use: No    Frequency: Never  . Drug use: Yes    Types: Marijuana    Comment: used lesterday     Allergies   Morphine and related; Hydrocodone-acetaminophen; Miralax [polyethylene glycol]; Codeine; and Eggs or egg-derived products   Review of Systems Review of Systems  Musculoskeletal:       Edema of right hand  All other systems reviewed and are negative.    Physical Exam Updated Vital Signs BP 114/85   Pulse (!) 102   Temp 98.4 F (36.9 C) (Oral)   Resp 18   Ht 5\' 4"  (1.626 m)   Wt 96.1 kg   SpO2 100%   BMI 36.37 kg/m   Physical Exam Vitals signs and nursing note reviewed.  Constitutional:      Appearance: She is not ill-appearing.  Eyes:     Conjunctiva/sclera: Conjunctivae normal.  Musculoskeletal:        General: Swelling and tenderness present.  Skin:    General: Skin is warm and dry.  Neurological:     Mental Status: She is alert and oriented to person, place, and time.  Psychiatric:        Mood and Affect: Mood normal.          ED Treatments / Results  Labs (all labs ordered are listed, but only abnormal results are displayed) Labs Reviewed - No data to display  EKG None  Radiology Dg Chest 2 View  Result Date: 07/06/2018 CLINICAL DATA:  Short of breath with chest pain EXAM: CHEST - 2 VIEW COMPARISON:  12/25/2016 FINDINGS: The heart size and mediastinal contours are within normal limits. Both lungs are clear. The visualized skeletal structures are unremarkable. IMPRESSION: No active cardiopulmonary disease. Electronically Signed   By: Jasmine Pang M.D.   On: 07/06/2018 16:25   Ct Head Wo Contrast  Result Date: 07/06/2018 CLINICAL DATA:  Recent fall in parking lot with headaches, initial encounter EXAM: CT HEAD WITHOUT CONTRAST TECHNIQUE: Contiguous axial images were obtained from the base of the skull through the vertex without intravenous contrast. COMPARISON:  10/21/2017 FINDINGS: Brain: No evidence of acute infarction, hemorrhage,  hydrocephalus, extra-axial collection or mass lesion/mass effect. Vascular: No hyperdense vessel or unexpected calcification. Skull: Normal. Negative for fracture or focal lesion. Sinuses/Orbits: No acute finding. Other: None. IMPRESSION: Normal head CT. Electronically Signed   By: Alcide Clever M.D.   On: 07/06/2018 16:52    Procedures Procedures (including critical care time)  Medications Ordered  in ED Medications - No data to display   Initial Impression / Assessment and Plan / ED Course  I have reviewed the triage vital signs and the nursing notes.  Pertinent labs & imaging results that were available during my care of the patient were reviewed by me and considered in my medical decision making (see chart for details).     Concern for potential compartment syndrome. Spoke with Dr. Merlyn LotKuzma (hand). He [facility]. Ring removed from right ring finger prior to transport.  Patient will be transferred by [facility]. Final Clinical Impressions(s) / ED Diagnoses   Final diagnoses:  Right hand pain  Extravasation injury    ED Discharge Orders    None       Felicie MornSmith, Chyan Carnero, NP 07/07/18 [id number]    Benjiman CorePickering, Nathan, MD 07/08/18 0003

## 2018-07-07 NOTE — ED Notes (Signed)
Reviewed d/c instructions with pt, who verbalized understanding and had no outstanding questions. Pt departed in NAD, refused use of wheelchair.   

## 2018-07-07 NOTE — ED Triage Notes (Signed)
Right hand pain. She had an IV infiltration while getting contrast for a CT scan.

## 2018-07-07 NOTE — Progress Notes (Signed)
Orthopedic Tech Progress Note Patient Details:  Gabriela GraffKimberly S Benitez [birth year] [id number] Saw dr in hallway asked me to apply splint in plaster and coband  Ortho Devices Type of Ortho Device: Volar splint Ortho Device/Splint Location: URE Ortho Device/Splint Interventions: Adjustment, Application, Ordered   Post Interventions Patient Tolerated: Well Instructions Provided: Care of device, Adjustment of device   Donald PoreSade L Sylar Benitez 07/07/2018, 10:02 PM

## 2019-02-17 ENCOUNTER — Other Ambulatory Visit: Payer: Self-pay

## 2019-02-17 ENCOUNTER — Emergency Department (HOSPITAL_BASED_OUTPATIENT_CLINIC_OR_DEPARTMENT_OTHER)
Admission: EM | Admit: 2019-02-17 | Discharge: 2019-02-17 | Disposition: A | Discharge status: Home / Self Care | Payer: Medicaid Other | Attending: Emergency Medicine | Admitting: Emergency Medicine

## 2019-02-17 ENCOUNTER — Emergency Department (HOSPITAL_BASED_OUTPATIENT_CLINIC_OR_DEPARTMENT_OTHER): Payer: Medicaid Other

## 2019-02-17 ENCOUNTER — Encounter (HOSPITAL_BASED_OUTPATIENT_CLINIC_OR_DEPARTMENT_OTHER): Payer: Self-pay | Admitting: *Deleted

## 2019-02-17 DIAGNOSIS — N898 Other specified noninflammatory disorders of vagina: Secondary | ICD-10-CM

## 2019-02-17 DIAGNOSIS — Z79899 Other long term (current) drug therapy: Secondary | ICD-10-CM | POA: Insufficient documentation

## 2019-02-17 DIAGNOSIS — R1030 Lower abdominal pain, unspecified: Secondary | ICD-10-CM

## 2019-02-17 DIAGNOSIS — N760 Acute vaginitis: Secondary | ICD-10-CM | POA: Diagnosis not present

## 2019-02-17 DIAGNOSIS — R197 Diarrhea, unspecified: Secondary | ICD-10-CM | POA: Diagnosis not present

## 2019-02-17 DIAGNOSIS — B9689 Other specified bacterial agents as the cause of diseases classified elsewhere: Secondary | ICD-10-CM | POA: Diagnosis not present

## 2019-02-17 DIAGNOSIS — Z87891 Personal history of nicotine dependence: Secondary | ICD-10-CM | POA: Diagnosis not present

## 2019-02-17 LAB — URINALYSIS, ROUTINE W REFLEX MICROSCOPIC
Bilirubin Urine: NEGATIVE
Glucose, UA: NEGATIVE mg/dL
Ketones, ur: NEGATIVE mg/dL
Leukocytes,Ua: NEGATIVE
Nitrite: NEGATIVE
Protein, ur: NEGATIVE mg/dL
Specific Gravity, Urine: 1.02 (ref 1.005–1.030)
pH: 6 (ref 5.0–8.0)

## 2019-02-17 LAB — COMPREHENSIVE METABOLIC PANEL
ALT: 23 U/L (ref 0–44)
AST: 22 U/L (ref 15–41)
Albumin: 4.1 g/dL (ref 3.5–5.0)
Alkaline Phosphatase: 98 U/L (ref 38–126)
Anion gap: 10 (ref 5–15)
BUN: 8 mg/dL (ref 6–20)
CO2: 23 mmol/L (ref 22–32)
Calcium: 9.2 mg/dL (ref 8.9–10.3)
Chloride: 104 mmol/L (ref 98–111)
Creatinine, Ser: 0.61 mg/dL (ref 0.44–1.00)
GFR calc Af Amer: >60 mL/min (ref >60)
GFR calc non Af Amer: >60 mL/min (ref >60)
Glucose, Bld: 105 mg/dL — ABNORMAL HIGH (ref 70–99)
Potassium: 4 mmol/L (ref 3.5–5.1)
Sodium: 137 mmol/L (ref 135–145)
Total Bilirubin: 0.5 mg/dL (ref 0.3–1.2)
Total Protein: 7.4 g/dL (ref 6.5–8.1)

## 2019-02-17 LAB — CBC WITH DIFFERENTIAL/PLATELET
Abs Immature Granulocytes: 0.03 10*3/uL (ref 0.00–0.07)
Basophils Absolute: 0.1 10*3/uL (ref 0.0–0.1)
Basophils Relative: 1 %
Eosinophils Absolute: 0.6 10*3/uL — ABNORMAL HIGH (ref 0.0–0.5)
Eosinophils Relative: 7 %
HCT: 44.1 % (ref 36.0–46.0)
Hemoglobin: 14.3 g/dL (ref 12.0–15.0)
Immature Granulocytes: 0 %
Lymphocytes Relative: 17 %
Lymphs Abs: 1.6 10*3/uL (ref 0.7–4.0)
MCH: 29.3 pg (ref 26.0–34.0)
MCHC: 32.4 g/dL (ref 30.0–36.0)
MCV: 90.4 fL (ref 80.0–100.0)
Monocytes Absolute: 0.6 10*3/uL (ref 0.1–1.0)
Monocytes Relative: 6 %
Neutro Abs: 6.2 10*3/uL (ref 1.7–7.7)
Neutrophils Relative %: 69 %
Platelets: 389 10*3/uL (ref 150–400)
RBC: 4.88 MIL/uL (ref 3.87–5.11)
RDW: 14.1 % (ref 11.5–15.5)
WBC: 9.1 10*3/uL (ref 4.0–10.5)
nRBC: 0 % (ref 0.0–0.2)

## 2019-02-17 LAB — WET PREP, GENITAL
Sperm: NONE SEEN
Trich, Wet Prep: NONE SEEN
Yeast Wet Prep HPF POC: NONE SEEN

## 2019-02-17 LAB — PREGNANCY, URINE: Preg Test, Ur: NEGATIVE

## 2019-02-17 LAB — URINALYSIS, MICROSCOPIC (REFLEX)

## 2019-02-17 LAB — LIPASE, BLOOD: Lipase: 27 U/L (ref 11–51)

## 2019-02-17 MED ORDER — IOHEXOL 300 MG/ML  SOLN
100.0000 mL | Freq: Once | INTRAMUSCULAR | Status: AC | PRN
  Administered 2019-02-17: 100 mL via INTRAVENOUS

## 2019-02-17 MED ORDER — METRONIDAZOLE 500 MG PO TABS: 500.0000 mg | ORAL_TABLET | Freq: Two times a day (BID) | ORAL | 0 refills | Status: AC

## 2019-02-17 NOTE — ED Triage Notes (Signed)
Dysuria for a month. Worse this am.

## 2019-02-17 NOTE — ED Provider Notes (Signed)
[facility] EMERGENCY DEPARTMENT Provider Note   CSN: [id number] Arrival date & time: 02/17/19  1559     History   Chief Complaint No chief complaint on file.   HPI Gabriela Benitez is a 38 y.o. female past medical history GERD, elevated LFTs, depression, anxiety, chronic abdominal pain, chronic diarrhea who presents to the ED today complaining of gradual onset, intermittent, bilateral lower abdominal pain x1 month with dysuria.  Patient also endorses vaginal odor.  And is sexually active with her husband.  No concern for STIs.  She reports that she has a history of chronic abdominal pain but it is mostly upper in nature.  She states that she has had a colonoscopy, endoscopy, multiple tests without obvious findings.  She thought that her lower abdominal pain was part of this for the past month so she has been ignoring it.  Patient reports that the pain became so bad last night that she decided to come to the ED.  She reports similar symptoms about 3 years ago when she had a kidney infection.  Denies fever, chills, change in nausea, change in diarrhea, vaginal discharge, pelvic pain, any other associated symptoms. PSHx includes cholecystectomy and cesarean section.       Past Medical History:  Diagnosis Date   Anxiety    Back pain    Carpal tunnel syndrome on both sides    Chronic back pain    Chronic neck pain    Depression    Elevated LFTs    acute pyleonephritis   Esophageal spasm    uses aloe vera with success   GERD (gastroesophageal reflux disease)    Zantac   Migraine     There are no active problems to display for this patient.   Past Surgical History:  Procedure Laterality Date   CESAREAN SECTION  2007   CESAREAN SECTION  01/27/2012   Procedure: CESAREAN SECTION;  Surgeon: Jolayne Haines, MD;  Location: [facility];  Service: Gynecology;  Laterality: N/A;   CHOLECYSTECTOMY     DILATION AND CURETTAGE OF UTERUS     NOSE SURGERY      TONSILLECTOMY       OB History    Gravida  3   Para  2   Term  2   Preterm      AB  1   Living  2     SAB  1   TAB      Ectopic      Multiple      Live Births  2            Home Medications    Prior to Admission medications   Medication Sig Start Date End Date Taking? Authorizing Provider  EPINEPHrine 0.3 mg/0.3 mL IJ SOAJ injection Inject 0.3 mg into the muscle as needed. 12/26/15   [provider]  fluticasone (FLONASE) 50 MCG/ACT nasal spray Place 1 spray into the nose 2 (two) times daily.    [provider]  fluvoxaMINE (LUVOX) 100 MG tablet Take 1 tablet by mouth as directed. One tablet in the morning and 1.5 at night 10/13/17   [provider]  HYDROcodone-acetaminophen (NORCO/VICODIN) 5-325 MG tablet Take 1 tablet by mouth every 4 (four) hours as needed. 11/09/17   Tegeler, Gwenyth Allegra, MD  linaclotide Rolan Lipa) 145 MCG CAPS capsule Take 1 capsule (145 mcg total) by mouth daily before breakfast. 10/28/16   Esterwood, Amy S, PA-C  medroxyPROGESTERone (DEPO-PROVERA) 150 MG/ML injection Inject 1  mL into the muscle every 3 (three) months. 07/27/17   [provider]  metroNIDAZOLE (FLAGYL) 500 MG tablet Take 1 tablet (500 mg total) by mouth 2 (two) times daily. 02/17/19   Francisco Ostrovsky, PA-C  Papaya 100 MG TABS Take 4 tablets by mouth 3 (three) times daily.    [provider]  potassium chloride (K-DUR) 10 MEQ tablet Take 1 tablet (10 mEq total) by mouth daily for 5 days. 07/06/18 07/11/18  Rodell Perna A, PA-C  ranitidine (ZANTAC) 150 MG tablet Take 1 tablet by mouth 2 (two) times daily.    [provider]    Family History Family History  Problem Relation Age of Onset   Stomach cancer Father    Colon cancer Paternal Grandmother    Colon polyps Neg Hx    Rectal cancer Neg Hx     Social History Social History   Tobacco Use   Smoking status: Former Smoker    Packs/day: 0.25    Years: 11.00    Pack  years: 2.75   Smokeless tobacco: Never Used  Substance Use Topics   Alcohol use: No    Frequency: Never   Drug use: Yes    Types: Marijuana    Comment: used lesterday     Allergies   Morphine and related, Hydrocodone-acetaminophen, Miralax [polyethylene glycol], Codeine, and Eggs or egg-derived products   Review of Systems Review of Systems  Constitutional: Negative for chills and fever.  HENT: Negative for congestion.   Eyes: Negative for visual disturbance.  Respiratory: Negative for cough.   Cardiovascular: Negative for chest pain.  Gastrointestinal: Positive for abdominal pain, diarrhea (chronic) and nausea (chronic).  Genitourinary: Positive for dysuria. Negative for flank pain, frequency, pelvic pain and vaginal discharge.  Musculoskeletal: Negative for myalgias.  Skin: Negative for rash.  Neurological: Negative for dizziness.     Physical Exam Updated Vital Signs BP (!) 112/93    Pulse 91    Temp 97.9 F (36.6 C) (Oral)    Resp 18    Ht _0  (1.6 m)    Wt 99.8 kg    SpO2 99%    BMI 38.97 kg/m   Physical Exam Vitals signs and nursing note reviewed.  Constitutional:      Appearance: She is not ill-appearing.  HENT:     Head: Normocephalic and atraumatic.  Eyes:     Conjunctiva/sclera: Conjunctivae normal.  Neck:     Musculoskeletal: Neck supple.  Cardiovascular:     Rate and Rhythm: Normal rate and regular rhythm.  Pulmonary:     Effort: Pulmonary effort is normal.     Breath sounds: Normal breath sounds.  Abdominal:     Palpations: Abdomen is soft.     Tenderness: There is abdominal tenderness. There is left CVA tenderness. There is no right CVA tenderness, guarding or rebound.     Comments: Soft, diffuse tenderness throughout, +BS throughout, no r/g/r, neg murphy's, + left CVA tenderness   Genitourinary:    Comments: Chaperone present for exam Lorie Apley, RN.  No rashes, lesions, or tenderness to external genitalia. No erythema, injury, or  tenderness to vaginal mucosa. Small amount of white discharge noted to vaginal vault. No blood. No adnexal masses, tenderness, or fullness. No CMT, cervical friability, or discharge from cervical os. Cervical os is closed. Uterus non-deviated, mobile, nonTTP, and without enlargement.   Skin:    General: Skin is warm and dry.  Neurological:     Mental Status: She is alert.  ED Treatments / Results  Labs (all labs ordered are listed, but only abnormal results are displayed) Labs Reviewed  WET PREP, GENITAL - Abnormal; Notable for the following components:      Result Value   Clue Cells Wet Prep HPF POC PRESENT (*)    WBC, Wet Prep HPF POC FEW (*)    All other components within normal limits  URINALYSIS, ROUTINE W REFLEX MICROSCOPIC - Abnormal; Notable for the following components:   Hgb urine dipstick TRACE (*)    All other components within normal limits  URINALYSIS, MICROSCOPIC (REFLEX) - Abnormal; Notable for the following components:   Bacteria, UA MANY (*)    All other components within normal limits  COMPREHENSIVE METABOLIC PANEL - Abnormal; Notable for the following components:   Glucose, Bld 105 (*)    All other components within normal limits  CBC WITH DIFFERENTIAL/PLATELET - Abnormal; Notable for the following components:   Eosinophils Absolute 0.6 (*)    All other components within normal limits  PREGNANCY, URINE  LIPASE, BLOOD  RPR  HIV ANTIBODY (ROUTINE TESTING W REFLEX)  GC/CHLAMYDIA PROBE AMP ([place]) NOT AT [facility]    EKG None  Radiology Ct Abdomen Pelvis W Contrast  Result Date: 02/17/2019 CLINICAL DATA:  Right and left lower quadrant abdominal pain. Diarrhea for 2 years EXAM: CT ABDOMEN AND PELVIS WITH CONTRAST TECHNIQUE: Multidetector CT imaging of the abdomen and pelvis was performed using the standard protocol following bolus administration of intravenous contrast. CONTRAST:  171m OMNIPAQUE IOHEXOL 300 MG/ML  SOLN COMPARISON:  CT 07/07/2018,  09/11/2016 FINDINGS: Lower chest: Lung bases are clear. Normal heart size. No pericardial effusion. Hepatobiliary: No focal liver abnormality is seen. Patient is post cholecystectomy. Slight prominence of the biliary tree likely related to reservoir effect. No calcified intraductal gallstones. Pancreas: Unremarkable. No pancreatic ductal dilatation or surrounding inflammatory changes. Spleen: Normal in size without focal abnormality. Adrenals/Urinary Tract: Adrenal glands are unremarkable. Kidneys are normal, without renal calculi, focal lesion, or hydronephrosis. Bladder appears circumferentially thickened. Stomach/Bowel: Distal esophagus, stomach and duodenal sweep are unremarkable. No bowel wall thickening or dilatation. No evidence of obstruction. A normal appendix is visualized. Vascular/Lymphatic: The aorta is normal caliber. No suspicious or enlarged lymph nodes in the included lymphatic chains. Reproductive: Normal anteverted uterus. 4.6 cm left adnexal cyst with few thin internal septations. No other adnexal lesions. Other: Trace simple attenuation fluid in the posterior cul-de-sac, likely physiologic. No free air. No organized collection or abscess. No bowel containing hernias. Musculoskeletal: Multilevel Schmorl's node formations are noted. No acute or suspicious osseous lesions. Angulation of the coccyx has a remote appearance. IMPRESSION: 1. Circumferential thickening of the bladder wall, correlate with urinalysis to exclude cystitis. 2. 4.6 cm left adnexal cyst with few thin internal septations. Finding is probably benign and may reflect a layering hemorrhagic cyst. Consider further evaluation with pelvic ultrasound at 6-12 weeks. This recommendation follows ACR consensus guidelines: White Paper of the ACR Incidental Findings Committee II on Adnexal Findings. J Am Coll Radiol [phone] Electronically Signed   By: PLovena LeM.D.   On: 02/17/2019 19:28    Procedures Procedures (including  critical care time)  Medications Ordered in ED Medications  iohexol (OMNIPAQUE) 300 MG/ML solution 100 mL (100 mLs Intravenous Contrast Given 02/17/19 1910)     Initial Impression / Assessment and Plan / ED Course  I have reviewed the triage vital signs and the nursing notes.  Pertinent labs & imaging results that were available during my care of  the patient were reviewed by me and considered in my medical decision making (see chart for details).    38 year old female who presents to the ED complaining of gradual onset, intermittent, bilateral lower abdominal pain, dysuria, vaginal odor x1 month.  3 of chronic abdominal pain, reports this is different.  Has not been taking anything for the pain.  Patient is convinced that she has a kidney infection due to similar symptoms about 3 years ago.  Analysis obtained prior to seen given complaint of dysuria.  Many bacteria seen with 6-10 squamous epithelial.  No leuk esterase, nitrites, white blood cells per high-power field.  Urine preg negative.  Patient does complain of vaginal odor will perform pelvic exam today.  Is sexually active with her husband.  She is not concerned about STIs at this point in time.  Patient still has her appendix.  He does have tenderness in the right lower quadrant.  Also has tenderness to left lower quadrant.  Very low concern for ovarian torsion given this is bilateral in nature.  Reports that when she had gallbladder issues a presented on the left side.  Questionable whether she is having appendix issues that are presenting on the left side as well.  Feel patient needs CT scan for further evaluation at this time.   No leukocytosis.  Hemoglobin stable.  No electrolyte abnormalities.  Creatinine within normal limits.  No elevation in LFTs.  Lipase negative.  Pelvic exam with small amount of white vaginal discharge in vault.  Wet  prep positive for clue cells.  Will treat with Flagyl outpatient.   CT with findings of thickening  to the bladder.  Suggests relation with UA.  Again UA without any signs of infection today.  CT does also show a cyst in the left ovary.  Patient reports she is aware of this.  Is recommended that she get a repeat pelvic ultrasound about 6 to 12 weeks.  On reevaluation patient states she is ready to go home.  She is hungry.  States she wants to go to [employer] and get a big Mac.  This is reassuring.  Will discharge patient home at this time.  Patient advised to follow-up with PCP regarding symptoms.  Discussed disulfiram-like reaction with the Flagyl and alcohol.  Advised to not drink alcohol for the next week while on this medicine.  Patient agrees with plan.  She is stable for discharge at this time.       Final Clinical Impressions(s) / ED Diagnoses   Final diagnoses:  Bacterial vaginosis  Lower abdominal pain  Vaginal odor    ED Discharge Orders         Ordered    metroNIDAZOLE (FLAGYL) 500 MG tablet  2 times daily     02/17/19 2023           Eustaquio Maize, PA-C 02/17/19 2219    Malvin Johns, MD 02/17/19 2223

## 2019-02-17 NOTE — Discharge Instructions (Signed)
You were seen in the ED today for lower abdominal pain, burning when you pee, and vaginal odor.  Your lab work was reassuring. Your urinalysis did not show any signs of infection. A CT scan of your abdomen did show a cyst in  your left ovary. It is recommended that you get a repeat ultrasound in 6-12 weeks for further evaluation. Please follow up with your PCP/OBGYN regarding this.  Your pelvic exam did show signs of bacterial vaginosis. This is a normal overgrowth of your vaginal bacteria. Please take medication as prescribed for the next week. DO NOT DRINK ANY ALCOHOL WHILE ON THIS MEDICATION.

## 2019-02-18 LAB — RPR: RPR Ser Ql: NONREACTIVE

## 2019-02-18 LAB — HIV ANTIBODY (ROUTINE TESTING W REFLEX): HIV Screen 4th Generation wRfx: NONREACTIVE

## 2019-02-21 LAB — GC/CHLAMYDIA PROBE AMP (~~LOC~~) NOT AT ARMC
Chlamydia: NEGATIVE
Neisseria Gonorrhea: NEGATIVE

## 2019-03-07 ENCOUNTER — Emergency Department (HOSPITAL_BASED_OUTPATIENT_CLINIC_OR_DEPARTMENT_OTHER)
Admission: EM | Admit: 2019-03-07 | Discharge: 2019-03-07 | Disposition: A | Discharge status: Home / Self Care | Payer: Medicaid Other | Attending: Emergency Medicine | Admitting: Emergency Medicine

## 2019-03-07 ENCOUNTER — Encounter (HOSPITAL_BASED_OUTPATIENT_CLINIC_OR_DEPARTMENT_OTHER): Payer: Self-pay | Admitting: Emergency Medicine

## 2019-03-07 ENCOUNTER — Other Ambulatory Visit: Payer: Self-pay

## 2019-03-07 DIAGNOSIS — Z87891 Personal history of nicotine dependence: Secondary | ICD-10-CM | POA: Insufficient documentation

## 2019-03-07 DIAGNOSIS — G8929 Other chronic pain: Secondary | ICD-10-CM

## 2019-03-07 DIAGNOSIS — Z79899 Other long term (current) drug therapy: Secondary | ICD-10-CM | POA: Insufficient documentation

## 2019-03-07 DIAGNOSIS — R109 Unspecified abdominal pain: Secondary | ICD-10-CM | POA: Insufficient documentation

## 2019-03-07 MED ORDER — CAPSAICIN 0.025 % EX GEL: 2.0000 g | Freq: Two times a day (BID) | CUTANEOUS | 2 refills | Status: AC

## 2019-03-07 MED ORDER — METOCLOPRAMIDE HCL 10 MG PO TABS: 10.0000 mg | ORAL_TABLET | Freq: Four times a day (QID) | ORAL | 0 refills | Status: AC

## 2019-03-07 NOTE — ED Provider Notes (Signed)
[facility] EMERGENCY DEPARTMENT Provider Note   CSN: [id number] Arrival date & time: 03/07/19  1727     History   Chief Complaint Chief Complaint  Patient presents with  . Flank Pain    HPI Gabriela Benitez is a 38 y.o. female.     HPI   Gabriela Benitez is a 38 y.o. female, with a history of chronic abdominal pain, chronic back pain, anxiety, GERD, and migraine, presenting to the ED with chronic abdominal pain for the past 2 years.  Her pain is generalized, sometimes cramping, sometimes sharp, fairly constant, moderate to severe.  Worsens with eating and bowel movements.  She has 7-8 loose stools daily.  Her pain and symptoms have not changed from her chronic state.  She states the only thing that she has found that somewhat improves the pain is marijuana.  She came to the ED today because she is running low on marijuana. She states she has been evaluated by multiple specialists and has undergone multiple imaging studies without a solid diagnosis.  Denies fever/chills, vomiting, hematochezia/melena, syncope, chest pain, shortness of breath, urinary symptoms, or any other complaints.  Past Medical History:  Diagnosis Date  . Anxiety   . Back pain   . Carpal tunnel syndrome on both sides   . Chronic back pain   . Chronic neck pain   . Depression   . Elevated LFTs    acute pyleonephritis  . Esophageal spasm    uses aloe vera with success  . GERD (gastroesophageal reflux disease)    Zantac  . Migraine     There are no active problems to display for this patient.   Past Surgical History:  Procedure Laterality Date  . CESAREAN SECTION  2007  . CESAREAN SECTION  01/27/2012   Procedure: CESAREAN SECTION;  Surgeon: Jolayne Haines, MD;  Location: [facility];  Service: Gynecology;  Laterality: N/A;  . CHOLECYSTECTOMY    . DILATION AND CURETTAGE OF UTERUS    . NOSE SURGERY    . TONSILLECTOMY       OB History    Gravida  3   Para  2   Term  2   Preterm       AB  1   Living  2     SAB  1   TAB      Ectopic      Multiple      Live Births  2            Home Medications    Prior to Admission medications   Medication Sig Start Date End Date Taking? Authorizing Provider  Capsaicin 0.025 % GEL Apply 2 g topically 2 (two) times daily. 03/07/19   ,  C, PA-C  EPINEPHrine 0.3 mg/0.3 mL IJ SOAJ injection Inject 0.3 mg into the muscle as needed. 12/26/15   [provider]  fluticasone (FLONASE) 50 MCG/ACT nasal spray Place 1 spray into the nose 2 (two) times daily.    [provider]  fluvoxaMINE (LUVOX) 100 MG tablet Take 1 tablet by mouth as directed. One tablet in the morning and 1.5 at night 10/13/17   [provider]  HYDROcodone-acetaminophen (NORCO/VICODIN) 5-325 MG tablet Take 1 tablet by mouth every 4 (four) hours as needed. 11/09/17   Tegeler, Gwenyth Allegra, MD  linaclotide Rolan Lipa) 145 MCG CAPS capsule Take 1 capsule (145 mcg total) by mouth daily before breakfast. 10/28/16   Esterwood, Amy S, PA-C  medroxyPROGESTERone (DEPO-PROVERA)  150 MG/ML injection Inject 1 mL into the muscle every 3 (three) months. 07/27/17   [provider]  metoCLOPramide (REGLAN) 10 MG tablet Take 1 tablet (10 mg total) by mouth every 6 (six) hours. 03/07/19   ,  C, PA-C  metroNIDAZOLE (FLAGYL) 500 MG tablet Take 1 tablet (500 mg total) by mouth 2 (two) times daily. 02/17/19   Venter, Margaux, PA-C  Papaya 100 MG TABS Take 4 tablets by mouth 3 (three) times daily.    [provider]  potassium chloride (K-DUR) 10 MEQ tablet Take 1 tablet (10 mEq total) by mouth daily for 5 days. 07/06/18 07/11/18  Michela PitcherFawze, Mina A, PA-C  ranitidine (ZANTAC) 150 MG tablet Take 1 tablet by mouth 2 (two) times daily.    [provider]    Family History Family History  Problem Relation Age of Onset  . Stomach cancer Father   . Colon cancer Paternal Grandmother   . Colon polyps Neg Hx   . Rectal cancer Neg Hx      Social History Social History   Tobacco Use  . Smoking status: Former Smoker    Packs/day: 0.25    Years: 11.00    Pack years: 2.75  . Smokeless tobacco: Never Used  Substance Use Topics  . Alcohol use: No    Frequency: Never  . Drug use: Yes    Types: Marijuana    Comment: used lesterday     Allergies   Morphine and related, Miralax [polyethylene glycol], Codeine, and Eggs or egg-derived products   Review of Systems Review of Systems  Constitutional: Negative for chills, diaphoresis and fever.  Respiratory: Negative for cough and shortness of breath.   Cardiovascular: Negative for chest pain.  Gastrointestinal: Positive for abdominal pain (chronic), diarrhea (chronic) and nausea (chronic). Negative for blood in stool and vomiting.  Genitourinary: Negative for difficulty urinating, dysuria, frequency, hematuria and vaginal discharge.  Musculoskeletal: Negative for back pain.  Neurological: Negative for dizziness, syncope, weakness, light-headedness and numbness.  All other systems reviewed and are negative.    Physical Exam Updated Vital Signs BP (!) 102/57 (BP Location: Left Arm) Comment: Pt lying on stomach  Pulse 63   Temp 97.7 F (36.5 C) (Oral)   Resp 18   Ht 5\' 3"  (1.6 m)   Wt 99.8 kg   SpO2 95%   BMI 38.97 kg/m   Physical Exam Vitals signs and nursing note reviewed.  Constitutional:      General: She is not in acute distress.    Appearance: She is well-developed. She is not diaphoretic.  HENT:     Head: Normocephalic and atraumatic.     Mouth/Throat:     Mouth: Mucous membranes are moist.     Pharynx: Oropharynx is clear.  Eyes:     Conjunctiva/sclera: Conjunctivae normal.  Neck:     Musculoskeletal: Neck supple.  Cardiovascular:     Rate and Rhythm: Normal rate and regular rhythm.     Pulses: Normal pulses.          Radial pulses are 2+ on the right side and 2+ on the left side.       Posterior tibial pulses are 2+ on the right side and 2+  on the left side.     Heart sounds: Normal heart sounds.     Comments: Tactile temperature in the extremities appropriate and equal bilaterally. Pulmonary:     Effort: Pulmonary effort is normal. No respiratory distress.     Breath sounds:  Normal breath sounds.  Abdominal:     General: Bowel sounds are normal.     Palpations: Abdomen is soft.     Tenderness: There is generalized abdominal tenderness. There is no guarding.  Musculoskeletal:     Right lower leg: No edema.     Left lower leg: No edema.  Lymphadenopathy:     Cervical: No cervical adenopathy.  Skin:    General: Skin is warm and dry.  Neurological:     Mental Status: She is alert.     Comments: Sensation grossly intact to light touch in the lower extremities bilaterally. No saddle anesthesias. Strength 5/5 in the bilateral lower extremities. No noted gait deficit.  Psychiatric:        Mood and Affect: Mood and affect normal.        Speech: Speech normal.        Behavior: Behavior normal.      ED Treatments / Results  Labs (all labs ordered are listed, but only abnormal results are displayed) Labs Reviewed - No data to display  EKG None  Radiology No results found.  Procedures Procedures (including critical care time)  Medications Ordered in ED Medications - No data to display   Initial Impression / Assessment and Plan / ED Course  I have reviewed the triage vital signs and the nursing notes.  Pertinent labs & imaging results that were available during my care of the patient were reviewed by me and considered in my medical decision making (see chart for details).        Patient presents with chronic abdominal pain.  She states multiple times her current pain is no different than previous pain.  We discussed the abilities as well as limitations of evaluation of chronic pain in the ED.  This conversation took quite a while and took up the vast majority of the visit.  I presented the patient with  options we could try for some symptom control.  I also stressed the importance of outpatient follow-up as well as the value of evaluations for second opinion. Return precautions discussed.  Patient voices understanding of these instructions, accepts the plan, and is comfortable with discharge.  Vitals:   03/07/19 1734 03/07/19 1735  BP: (!) 102/57   Pulse: 63   Resp: 18   Temp: 97.7 F (36.5 C)   TempSrc: Oral   SpO2: 95%   Weight:  99.8 kg  Height:  5\' 3"  (1.6 m)     Final Clinical Impressions(s) / ED Diagnoses   Final diagnoses:  Chronic abdominal pain    ED Discharge Orders         Ordered    Capsaicin 0.025 % GEL  2 times daily     03/07/19 1825    metoCLOPramide (REGLAN) 10 MG tablet  Every 6 hours     03/07/19 1825           Anselm Pancoast, PA-C 03/08/19 1704    Virgina Norfolk, DO 03/08/19 1722

## 2019-03-07 NOTE — Discharge Instructions (Addendum)
°  Capsaicin: May apply this to painful areas on the abdomen, usually the epigastric region, as needed for relief of pain and nausea.  Reglan: This medication may be used to help with nausea and can sometimes help with diarrhea.  Follow-up: Recommend follow-up with your primary care provider as well as obtaining second opinions from other gastroenterologists and abdominal pain specialists.

## 2019-03-07 NOTE — ED Triage Notes (Signed)
Pt c/o left flank pain. Pt reports prior treatment in [facility] approx 1 week ago and prescribed abx, reports taking all and some improvement, but symptoms returned Friday. Pr denies fever

## 2019-04-03 ENCOUNTER — Emergency Department (HOSPITAL_BASED_OUTPATIENT_CLINIC_OR_DEPARTMENT_OTHER)
Admission: EM | Admit: 2019-04-03 | Discharge: 2019-04-03 | Disposition: A | Discharge status: Home / Self Care | Payer: Medicaid Other | Attending: Emergency Medicine | Admitting: Emergency Medicine

## 2019-04-03 ENCOUNTER — Emergency Department (HOSPITAL_BASED_OUTPATIENT_CLINIC_OR_DEPARTMENT_OTHER): Payer: Medicaid Other

## 2019-04-03 ENCOUNTER — Other Ambulatory Visit: Payer: Self-pay

## 2019-04-03 ENCOUNTER — Encounter (HOSPITAL_BASED_OUTPATIENT_CLINIC_OR_DEPARTMENT_OTHER): Payer: Self-pay

## 2019-04-03 DIAGNOSIS — R519 Headache, unspecified: Secondary | ICD-10-CM | POA: Diagnosis not present

## 2019-04-03 DIAGNOSIS — Z79899 Other long term (current) drug therapy: Secondary | ICD-10-CM | POA: Insufficient documentation

## 2019-04-03 DIAGNOSIS — Y9389 Activity, other specified: Secondary | ICD-10-CM | POA: Insufficient documentation

## 2019-04-03 DIAGNOSIS — M542 Cervicalgia: Secondary | ICD-10-CM | POA: Diagnosis not present

## 2019-04-03 DIAGNOSIS — Y998 Other external cause status: Secondary | ICD-10-CM | POA: Insufficient documentation

## 2019-04-03 DIAGNOSIS — Z87891 Personal history of nicotine dependence: Secondary | ICD-10-CM | POA: Diagnosis not present

## 2019-04-03 DIAGNOSIS — F1210 Cannabis abuse, uncomplicated: Secondary | ICD-10-CM | POA: Diagnosis not present

## 2019-04-03 DIAGNOSIS — H538 Other visual disturbances: Secondary | ICD-10-CM | POA: Insufficient documentation

## 2019-04-03 DIAGNOSIS — Y929 Unspecified place or not applicable: Secondary | ICD-10-CM | POA: Diagnosis not present

## 2019-04-03 DIAGNOSIS — W19XXXA Unspecified fall, initial encounter: Secondary | ICD-10-CM | POA: Diagnosis not present

## 2019-04-03 DIAGNOSIS — R400 Somnolence: Secondary | ICD-10-CM | POA: Insufficient documentation

## 2019-04-03 DIAGNOSIS — S0990XA Unspecified injury of head, initial encounter: Secondary | ICD-10-CM | POA: Insufficient documentation

## 2019-04-03 DIAGNOSIS — R1110 Vomiting, unspecified: Secondary | ICD-10-CM | POA: Diagnosis not present

## 2019-04-03 MED ORDER — ONDANSETRON 4 MG PO TBDP
4.0000 mg | ORAL_TABLET | Freq: Once | ORAL | Status: AC
  Administered 2019-04-03: 4 mg via ORAL
  Filled 2019-04-03: qty 1

## 2019-04-03 NOTE — Discharge Instructions (Signed)
Please follow up with your psychiatrist and primary care doctor [facility] as discussed. Please return to ED if you have any new or concerning symptoms. Please read the attached information on concussions.

## 2019-04-03 NOTE — ED Triage Notes (Signed)
Pt c/o fall in the shower on 9/27. Pt reports pain in her head, neck, and shoulders. Pt states she has been sleeping more and just realized it has been over a week since it happened. Pt is A/Ox4 in triage.

## 2019-04-03 NOTE — ED Provider Notes (Signed)
[facility] EMERGENCY DEPARTMENT Provider Note   CSN: [id number] Arrival date & time: 04/03/19  1418     History   Chief Complaint Chief Complaint  Patient presents with   Fall    HPI Gabriela Benitez is a 38 y.o. female past medical history significant for chronic back pain, neck pain, abdominal pain, anxiety, depression, migraines.   Presents for a fall that occurred 1 week ago.  Patient states that concern over neck pain and continued headache is what brought her in today.  Patient states neck pain is worse than usual chronic neck pain.  Patient states she "wanted to be checked out."    Patient states headache is no worse than prior migraine episodes and feels similar.  States onset of headache was gradual after head injury.  Denies loss of consciousness, no focal weakness, changes in mental status other than drowsiness.  Patient states that she has had a headache since the fall occurred and has been sleeping more frequently.  Patient states she has a history of insomnia and usually has difficulty sleeping but has been sleeping majority of the day since the fall occurred.  Patient states she does occasionally have blurry vision but denies any other visual disturbances.  Endorses 2 episodes of vomiting over the past week which she says is consistent with a normal number of episodes of emesis she has.  Patient states she has used marijuana Gummies which is improved her pain for the past week.  Patient denies fevers, chills, chest pain, diaphoresis, shortness of breath, lightheadedness.     HPI  Past Medical History:  Diagnosis Date   Anxiety    Back pain    Carpal tunnel syndrome on both sides    Chronic back pain    Chronic neck pain    Depression    Elevated LFTs    acute pyleonephritis   Esophageal spasm    uses aloe vera with success   GERD (gastroesophageal reflux disease)    Zantac   Migraine     There are no active problems to display for  this patient.   Past Surgical History:  Procedure Laterality Date   CESAREAN SECTION  2007   CESAREAN SECTION  01/27/2012   Procedure: CESAREAN SECTION;  Surgeon: Delbert Harnessaniel H Moore, MD;  Location: [facility];  Service: Gynecology;  Laterality: N/A;   CHOLECYSTECTOMY     DILATION AND CURETTAGE OF UTERUS     NOSE SURGERY     TONSILLECTOMY       OB History    Gravida  3   Para  2   Term  2   Preterm      AB  1   Living  2     SAB  1   TAB      Ectopic      Multiple      Live Births  2            Home Medications    Prior to Admission medications   Medication Sig Start Date End Date Taking? Authorizing Provider  Capsaicin 0.025 % GEL Apply 2 g topically 2 (two) times daily. 03/07/19   Joy, Shawn C, PA-C  EPINEPHrine 0.3 mg/0.3 mL IJ SOAJ injection Inject 0.3 mg into the muscle as needed. 12/26/15   [provider]  fluticasone (FLONASE) 50 MCG/ACT nasal spray Place 1 spray into the nose 2 (two) times daily.    [provider]  fluvoxaMINE (LUVOX) 100 MG tablet  Take 1 tablet by mouth as directed. One tablet in the morning and 1.5 at night 10/13/17   [provider]  HYDROcodone-acetaminophen (NORCO/VICODIN) 5-325 MG tablet Take 1 tablet by mouth every 4 (four) hours as needed. 11/09/17   Tegeler, Canary Brim, MD  linaclotide Karlene Einstein) 145 MCG CAPS capsule Take 1 capsule (145 mcg total) by mouth daily before breakfast. 10/28/16   Esterwood, Amy S, PA-C  medroxyPROGESTERone (DEPO-PROVERA) 150 MG/ML injection Inject 1 mL into the muscle every 3 (three) months. 07/27/17   [provider]  metoCLOPramide (REGLAN) 10 MG tablet Take 1 tablet (10 mg total) by mouth every 6 (six) hours. 03/07/19   Joy, Shawn C, PA-C  metroNIDAZOLE (FLAGYL) 500 MG tablet Take 1 tablet (500 mg total) by mouth 2 (two) times daily. 02/17/19   Venter, Margaux, PA-C  Papaya 100 MG TABS Take 4 tablets by mouth 3 (three) times daily.    [provider]  potassium  chloride (K-DUR) 10 MEQ tablet Take 1 tablet (10 mEq total) by mouth daily for 5 days. 07/06/18 07/11/18  Michela Pitcher A, PA-C  ranitidine (ZANTAC) 150 MG tablet Take 1 tablet by mouth 2 (two) times daily.    [provider]    Family History Family History  Problem Relation Age of Onset   Stomach cancer Father    Colon cancer Paternal Grandmother    Colon polyps Neg Hx    Rectal cancer Neg Hx     Social History Social History   Tobacco Use   Smoking status: Former Smoker    Packs/day: 0.25    Years: 11.00    Pack years: 2.75   Smokeless tobacco: Never Used  Substance Use Topics   Alcohol use: Not Currently    Frequency: Never   Drug use: Yes    Types: Marijuana    Comment: used lesterday     Allergies   Morphine and related, Miralax [polyethylene glycol], Codeine, and Eggs or egg-derived products   Review of Systems Review of Systems  All other systems reviewed and are negative.    Physical Exam Updated Vital Signs BP 104/74 (BP Location: Left Arm)    Pulse 71    Temp 97.8 F (36.6 C) (Oral)    Resp 20    Ht  (1.6 m)    Wt 102.1 kg    SpO2 96%    BMI 39.86 kg/m   Physical Exam Vitals signs and nursing note reviewed.  Constitutional:      General: She is not in acute distress. HENT:     Head: Normocephalic and atraumatic.     Nose: Nose normal.  Eyes:     General: No scleral icterus. Neck:     Musculoskeletal: Normal range of motion. Muscular tenderness present.     Comments: Focal bony tenderness midline approximately C7. Cardiovascular:     Rate and Rhythm: Normal rate and regular rhythm.     Pulses: Normal pulses.     Heart sounds: Normal heart sounds.  Pulmonary:     Effort: Pulmonary effort is normal. No respiratory distress.     Breath sounds: No wheezing.  Abdominal:     Palpations: Abdomen is soft.     Tenderness: There is no abdominal tenderness. There is no guarding or rebound.  Musculoskeletal:     Right lower leg: No  edema.     Left lower leg: No edema.     Comments: Patient has general tenderness over her right hip without bruising  or focal bony tenderness.  Midline back is without focal bony tenderness  Skin:    General: Skin is warm and dry.     Capillary Refill: Capillary refill takes less than 2 seconds.  Neurological:     Mental Status: She is alert. Mental status is at baseline.     Comments: Alert and oriented to self, place, time and event.  Speech is fluent, clear without dysarthria or dysphasia.  Strength 5/5 in upper/lower extremities  Sensation intact in upper/lower extremities  Normal finger-to-nose and feet tapping.  CN I not tested  CN II grossly intact visual fields bilaterally. Did not visualize posterior eye.   CN III, IV, VI PERRLA and EOMs intact bilaterally  CN V Intact sensation to sharp and light touch to the face  CN VII facial movements symmetric  CN VIII not tested  CN IX, X no uvula deviation, symmetric rise of soft palate  CN XI 5/5 SCM and trapezius strength bilaterally  CN XII Midline tongue protrusion, symmetric L/R movements   Psychiatric:        Mood and Affect: Mood normal.        Behavior: Behavior normal.      ED Treatments / Results  Labs (all labs ordered are listed, but only abnormal results are displayed) Labs Reviewed - No data to display  EKG None  Radiology Ct Cervical Spine Wo Contrast  Result Date: 04/03/2019 CLINICAL DATA:  Neck pain after fall last week. EXAM: CT CERVICAL SPINE WITHOUT CONTRAST TECHNIQUE: Multidetector CT imaging of the cervical spine was performed without intravenous contrast. Multiplanar CT image reconstructions were also generated. COMPARISON:  None. FINDINGS: Alignment: Normal. Skull base and vertebrae: No acute fracture. No primary bone lesion or focal pathologic process. Soft tissues and spinal canal: No prevertebral fluid or swelling. No visible canal hematoma. Disc levels:  Normal. Upper chest: Negative. Other:  None. IMPRESSION: Normal cervical spine. Electronically Signed   By: Lupita Raider M.D.   On: 04/03/2019 15:23    Procedures Procedures (including critical care time)  Medications Ordered in ED Medications  ondansetron (ZOFRAN-ODT) disintegrating tablet 4 mg (4 mg Oral Given 04/03/19 1520)     Initial Impression / Assessment and Plan / ED Course  I have reviewed the triage vital signs and the nursing notes.  Pertinent labs & imaging results that were available during my care of the patient were reviewed by me and considered in my medical decision making (see chart for details).         Patient 38 year old female with chronic pain conditions including abdominal pain, neck pain, back pain presenting with increased drowsiness and headache 1 week after fall and head injury without LOC.   During triage patient answered yes to thoughts of harming herself.  Upon questioning patient states she has no intent to hurt herself and has no plan.  Patient states that she does passively consider hurting her self at times.  Patient declines talking to TTS during ED visit.  Patient will follow-up with her PCP to discuss mental health concerns.  Patient has chronic depression that she states is unchanged and not worsening.  Patient is without any red flag symptoms of headache, is not on a blood thinner, and without signs of basilar skull fracture or depressed skull fracture.  Patient is 1 week removed from injury she is low risk for intracranial bleed.  No CT needed at this time per [place] CT rules this patient is low risk.  He did CT  of cervical spine was conducted due to midline tenderness and patient concern showed no acute fracture.  Discussed postconcussive syndrome with patient most likely cause of symptoms.  Patient given strict return precautions if she has new or concerning symptoms.  Patient will follow-up with her PCP Eldridge Abrahams within the week for reassessment.   Patients vitals are within  normal limits during ED visit. Patient is agreeable to plan.      The patient appears reasonably screened and/or stabilized for discharge and I doubt any other medical condition or other [facility] requiring further screening, evaluation, or treatment in the ED at this time prior to discharge.  Patient is hemodynamically stable, in NAD, and able to ambulate in the ED. Pain has been managed or a plan has been made for home management and has no complaints prior to discharge. Patient is comfortable with above plan and is stable for discharge at this time. All questions were answered prior to disposition. Results from the ER workup discussed with the patient face to face and all questions answered to the best of my ability. The patient is safe for discharge with strict return precautions. Patient appears safe for discharge with appropriate follow-up.  Conveyed my impression with the patient and he voiced understanding and is agreeable to plan.   An After Visit Summary was printed and given to the patient.  Portions of this note were generated with [employer]. Dictation errors may occur despite best attempts at proofreading.      Final Clinical Impressions(s) / ED Diagnoses   Final diagnoses:  Injury of head, initial encounter  Fall, initial encounter    ED Discharge Orders    None       Tedd Sias, [place] 04/04/19 Tomasita Crumble, MD 04/04/19 [phone]

## 2019-04-03 NOTE — ED Notes (Signed)
Fell several days ago in shower  C/o head, neck and shoulder pain  A&O

## 2019-04-03 NOTE — ED Triage Notes (Signed)
During SI screening pt answered "yes" to the question of recent thoughts about harming herself. Pt denies a plan, and denies intent on acting upon the thoughts. EDP, charge RN, and security made aware.
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
